# Patient Record
Sex: Male | Born: 1980 | Race: White | Hispanic: No | Marital: Married | Smoking: Never smoker
Health system: Southern US, Community
[De-identification: ages and names within clinical notes are randomized; demographics above are authoritative.]

## PROBLEM LIST (undated history)

## (undated) DIAGNOSIS — E119 Type 2 diabetes mellitus without complications: Secondary | ICD-10-CM

## (undated) DIAGNOSIS — M75122 Complete rotator cuff tear or rupture of left shoulder, not specified as traumatic: Secondary | ICD-10-CM

## (undated) HISTORY — PX: CORNEAL LACERATION REPAIR: SHX5331

## (undated) HISTORY — DX: Type 2 diabetes mellitus without complications: E11.9

---
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
## 2000-04-25 ENCOUNTER — Emergency Department (HOSPITAL_COMMUNITY): Admission: EM | Admit: 2000-04-25 | Discharge: 2000-04-25 | Payer: Self-pay | Admitting: Emergency Medicine

## 2005-05-25 ENCOUNTER — Emergency Department (HOSPITAL_COMMUNITY): Admission: EM | Admit: 2005-05-25 | Discharge: 2005-05-25 | Payer: Self-pay | Admitting: Family Medicine

## 2009-11-28 ENCOUNTER — Emergency Department (HOSPITAL_COMMUNITY): Admission: EM | Admit: 2009-11-28 | Discharge: 2009-11-28 | Payer: Self-pay | Admitting: Emergency Medicine

## 2019-03-07 ENCOUNTER — Other Ambulatory Visit: Payer: Self-pay | Admitting: Sports Medicine

## 2019-03-07 DIAGNOSIS — Z77018 Contact with and (suspected) exposure to other hazardous metals: Secondary | ICD-10-CM

## 2019-03-07 DIAGNOSIS — M25512 Pain in left shoulder: Secondary | ICD-10-CM

## 2019-03-16 ENCOUNTER — Other Ambulatory Visit: Payer: Self-pay | Admitting: Sports Medicine

## 2019-03-21 ENCOUNTER — Other Ambulatory Visit: Payer: Self-pay | Admitting: Sports Medicine

## 2019-03-21 ENCOUNTER — Ambulatory Visit
Admission: RE | Admit: 2019-03-21 | Discharge: 2019-03-21 | Disposition: A | Discharge status: Home / Self Care | Payer: BC Managed Care – PPO | Source: Ambulatory Visit | Attending: Sports Medicine | Admitting: Sports Medicine

## 2019-03-21 ENCOUNTER — Other Ambulatory Visit: Payer: Self-pay

## 2019-03-21 DIAGNOSIS — Z77018 Contact with and (suspected) exposure to other hazardous metals: Secondary | ICD-10-CM

## 2019-03-21 DIAGNOSIS — M25512 Pain in left shoulder: Secondary | ICD-10-CM

## 2019-03-28 ENCOUNTER — Other Ambulatory Visit: Payer: Self-pay

## 2019-03-28 ENCOUNTER — Encounter (HOSPITAL_BASED_OUTPATIENT_CLINIC_OR_DEPARTMENT_OTHER): Payer: Self-pay | Admitting: *Deleted

## 2019-04-01 ENCOUNTER — Other Ambulatory Visit (HOSPITAL_COMMUNITY)
Admission: RE | Admit: 2019-04-01 | Discharge: 2019-04-01 | Disposition: A | Discharge status: Home / Self Care | Payer: BC Managed Care – PPO | Source: Ambulatory Visit | Attending: Orthopedic Surgery | Admitting: Orthopedic Surgery

## 2019-04-01 DIAGNOSIS — Z01812 Encounter for preprocedural laboratory examination: Secondary | ICD-10-CM | POA: Diagnosis present

## 2019-04-01 DIAGNOSIS — Z1159 Encounter for screening for other viral diseases: Secondary | ICD-10-CM | POA: Diagnosis not present

## 2019-04-01 NOTE — Progress Notes (Signed)
Pt arrived to pick up Ensure Pre-Surgery drink. Written/verbal instructions given to finish drinking drink by 0815 on DOS. Pt verbalized understanding

## 2019-04-02 LAB — NOVEL CORONAVIRUS, NAA (HOSP ORDER, SEND-OUT TO REF LAB; TAT 18-24 HRS): SARS-CoV-2, NAA: NOT DETECTED

## 2019-04-05 ENCOUNTER — Ambulatory Visit (HOSPITAL_BASED_OUTPATIENT_CLINIC_OR_DEPARTMENT_OTHER): Payer: BC Managed Care – PPO | Admitting: Certified Registered Nurse Anesthetist

## 2019-04-05 ENCOUNTER — Encounter (HOSPITAL_BASED_OUTPATIENT_CLINIC_OR_DEPARTMENT_OTHER)
Admission: RE | Disposition: A | Discharge status: Home / Self Care | Payer: Self-pay | Source: Home / Self Care | Attending: Orthopedic Surgery

## 2019-04-05 ENCOUNTER — Ambulatory Visit (HOSPITAL_BASED_OUTPATIENT_CLINIC_OR_DEPARTMENT_OTHER)
Admission: RE | Admit: 2019-04-05 | Discharge: 2019-04-05 | Disposition: A | Discharge status: Home / Self Care | Payer: BC Managed Care – PPO | Attending: Orthopedic Surgery | Admitting: Orthopedic Surgery

## 2019-04-05 ENCOUNTER — Encounter (HOSPITAL_BASED_OUTPATIENT_CLINIC_OR_DEPARTMENT_OTHER): Payer: Self-pay | Admitting: *Deleted

## 2019-04-05 DIAGNOSIS — S43492A Other sprain of left shoulder joint, initial encounter: Secondary | ICD-10-CM | POA: Insufficient documentation

## 2019-04-05 DIAGNOSIS — S43005A Unspecified dislocation of left shoulder joint, initial encounter: Secondary | ICD-10-CM | POA: Insufficient documentation

## 2019-04-05 DIAGNOSIS — S46012A Strain of muscle(s) and tendon(s) of the rotator cuff of left shoulder, initial encounter: Secondary | ICD-10-CM | POA: Insufficient documentation

## 2019-04-05 DIAGNOSIS — W1789XA Other fall from one level to another, initial encounter: Secondary | ICD-10-CM | POA: Diagnosis not present

## 2019-04-05 DIAGNOSIS — Y9389 Activity, other specified: Secondary | ICD-10-CM | POA: Insufficient documentation

## 2019-04-05 DIAGNOSIS — M7542 Impingement syndrome of left shoulder: Secondary | ICD-10-CM | POA: Insufficient documentation

## 2019-04-05 DIAGNOSIS — M75122 Complete rotator cuff tear or rupture of left shoulder, not specified as traumatic: Secondary | ICD-10-CM

## 2019-04-05 DIAGNOSIS — M75102 Unspecified rotator cuff tear or rupture of left shoulder, not specified as traumatic: Secondary | ICD-10-CM | POA: Diagnosis present

## 2019-04-05 HISTORY — PX: SHOULDER ARTHROSCOPY WITH ROTATOR CUFF REPAIR AND SUBACROMIAL DECOMPRESSION: SHX5686

## 2019-04-05 HISTORY — DX: Complete rotator cuff tear or rupture of left shoulder, not specified as traumatic: M75.122

## 2019-04-05 HISTORY — PX: SHOULDER ARTHROSCOPY WITH BANKART REPAIR: SHX5673

## 2019-04-05 SURGERY — SHOULDER ARTHROSCOPY WITH ROTATOR CUFF REPAIR AND SUBACROMIAL DECOMPRESSION
Anesthesia: General | Site: Shoulder | Laterality: Left

## 2019-04-05 MED ORDER — MIDAZOLAM HCL 2 MG/2ML IJ SOLN
INTRAMUSCULAR | Status: AC
  Filled 2019-04-05: qty 2

## 2019-04-05 MED ORDER — BUPIVACAINE LIPOSOME 1.3 % IJ SUSP
INTRAMUSCULAR | Status: DC | PRN
  Administered 2019-04-05: 10 mL via PERINEURAL

## 2019-04-05 MED ORDER — PHENYLEPHRINE 40 MCG/ML (10ML) SYRINGE FOR IV PUSH (FOR BLOOD PRESSURE SUPPORT)
PREFILLED_SYRINGE | INTRAVENOUS | Status: AC
  Filled 2019-04-05: qty 10

## 2019-04-05 MED ORDER — LACTATED RINGERS IV SOLN: INTRAVENOUS | Status: DC

## 2019-04-05 MED ORDER — ACETAMINOPHEN 500 MG PO TABS
ORAL_TABLET | ORAL | Status: AC
  Filled 2019-04-05: qty 2

## 2019-04-05 MED ORDER — ACETAMINOPHEN 500 MG PO TABS
1000.0000 mg | ORAL_TABLET | Freq: Once | ORAL | Status: AC
  Administered 2019-04-05: 11:00:00 1000 mg via ORAL

## 2019-04-05 MED ORDER — SCOPOLAMINE 1 MG/3DAYS TD PT72: 1.0000 | MEDICATED_PATCH | Freq: Once | TRANSDERMAL | Status: DC | PRN

## 2019-04-05 MED ORDER — FENTANYL CITRATE (PF) 250 MCG/5ML IJ SOLN
INTRAMUSCULAR | Status: DC | PRN
  Administered 2019-04-05: 100 ug via INTRAVENOUS

## 2019-04-05 MED ORDER — METOCLOPRAMIDE HCL 5 MG/ML IJ SOLN: 10.0000 mg | Freq: Once | INTRAMUSCULAR | Status: DC | PRN

## 2019-04-05 MED ORDER — ONDANSETRON HCL 4 MG/2ML IJ SOLN
INTRAMUSCULAR | Status: AC
  Filled 2019-04-05: qty 2

## 2019-04-05 MED ORDER — SODIUM CHLORIDE 0.9 % IR SOLN
Status: DC | PRN
  Administered 2019-04-05: 35000 mL

## 2019-04-05 MED ORDER — OXYCODONE HCL 5 MG PO TABS: 5.0000 mg | ORAL_TABLET | ORAL | 0 refills | Status: DC | PRN

## 2019-04-05 MED ORDER — MEPERIDINE HCL 25 MG/ML IJ SOLN: 6.2500 mg | INTRAMUSCULAR | Status: DC | PRN

## 2019-04-05 MED ORDER — SODIUM CHLORIDE 0.9 % IV SOLN
INTRAVENOUS | Status: DC | PRN
  Administered 2019-04-05: 40 ug/min via INTRAVENOUS

## 2019-04-05 MED ORDER — SENNA-DOCUSATE SODIUM 8.6-50 MG PO TABS: 2.0000 | ORAL_TABLET | Freq: Every day | ORAL | 1 refills | Status: DC

## 2019-04-05 MED ORDER — CEFAZOLIN SODIUM-DEXTROSE 2-4 GM/100ML-% IV SOLN
INTRAVENOUS | Status: AC
  Filled 2019-04-05: qty 200

## 2019-04-05 MED ORDER — ROCURONIUM BROMIDE 10 MG/ML (PF) SYRINGE
PREFILLED_SYRINGE | INTRAVENOUS | Status: DC | PRN
  Administered 2019-04-05: 40 mg via INTRAVENOUS
  Administered 2019-04-05 (×2): 50 mg via INTRAVENOUS

## 2019-04-05 MED ORDER — DEXAMETHASONE SODIUM PHOSPHATE 10 MG/ML IJ SOLN
INTRAMUSCULAR | Status: DC | PRN
  Administered 2019-04-05: 5 mg via INTRAVENOUS

## 2019-04-05 MED ORDER — FENTANYL CITRATE (PF) 100 MCG/2ML IJ SOLN
50.0000 ug | INTRAMUSCULAR | Status: DC | PRN
  Administered 2019-04-05: 100 ug via INTRAVENOUS

## 2019-04-05 MED ORDER — GLYCOPYRROLATE 0.2 MG/ML IJ SOLN
INTRAMUSCULAR | Status: DC | PRN
  Administered 2019-04-05: 0.2 mg via INTRAVENOUS

## 2019-04-05 MED ORDER — ROCURONIUM BROMIDE 10 MG/ML (PF) SYRINGE
PREFILLED_SYRINGE | INTRAVENOUS | Status: AC
  Filled 2019-04-05: qty 10

## 2019-04-05 MED ORDER — SUGAMMADEX SODIUM 500 MG/5ML IV SOLN
INTRAVENOUS | Status: AC
  Filled 2019-04-05: qty 5

## 2019-04-05 MED ORDER — LIDOCAINE 2% (20 MG/ML) 5 ML SYRINGE
INTRAMUSCULAR | Status: DC | PRN
  Administered 2019-04-05: 100 mg via INTRAVENOUS

## 2019-04-05 MED ORDER — FENTANYL CITRATE (PF) 100 MCG/2ML IJ SOLN: 25.0000 ug | INTRAMUSCULAR | Status: DC | PRN

## 2019-04-05 MED ORDER — DEXAMETHASONE SODIUM PHOSPHATE 10 MG/ML IJ SOLN
INTRAMUSCULAR | Status: AC
  Filled 2019-04-05: qty 1

## 2019-04-05 MED ORDER — BUPIVACAINE HCL (PF) 0.5 % IJ SOLN
INTRAMUSCULAR | Status: DC | PRN
  Administered 2019-04-05: 15 mL

## 2019-04-05 MED ORDER — CEFAZOLIN SODIUM-DEXTROSE 2-4 GM/100ML-% IV SOLN
2.0000 g | INTRAVENOUS | Status: AC
  Administered 2019-04-05: 3 g via INTRAVENOUS

## 2019-04-05 MED ORDER — FENTANYL CITRATE (PF) 100 MCG/2ML IJ SOLN
INTRAMUSCULAR | Status: AC
  Filled 2019-04-05: qty 2

## 2019-04-05 MED ORDER — ONDANSETRON HCL 4 MG PO TABS: 4.0000 mg | ORAL_TABLET | Freq: Three times a day (TID) | ORAL | 0 refills | Status: DC | PRN

## 2019-04-05 MED ORDER — MIDAZOLAM HCL 2 MG/2ML IJ SOLN
1.0000 mg | INTRAMUSCULAR | Status: DC | PRN
  Administered 2019-04-05: 2 mg via INTRAVENOUS

## 2019-04-05 MED ORDER — PROPOFOL 10 MG/ML IV BOLUS
INTRAVENOUS | Status: DC | PRN
  Administered 2019-04-05: 200 mg via INTRAVENOUS

## 2019-04-05 MED ORDER — SUCCINYLCHOLINE CHLORIDE 200 MG/10ML IV SOSY
PREFILLED_SYRINGE | INTRAVENOUS | Status: AC
  Filled 2019-04-05: qty 10

## 2019-04-05 MED ORDER — LACTATED RINGERS IV SOLN
INTRAVENOUS | Status: DC
  Administered 2019-04-05 (×2): via INTRAVENOUS

## 2019-04-05 MED ORDER — BACLOFEN 10 MG PO TABS: 10.0000 mg | ORAL_TABLET | Freq: Three times a day (TID) | ORAL | 0 refills | Status: DC

## 2019-04-05 MED ORDER — SUCCINYLCHOLINE CHLORIDE 200 MG/10ML IV SOSY
PREFILLED_SYRINGE | INTRAVENOUS | Status: DC | PRN
  Administered 2019-04-05: 120 mg via INTRAVENOUS

## 2019-04-05 MED ORDER — PHENYLEPHRINE 40 MCG/ML (10ML) SYRINGE FOR IV PUSH (FOR BLOOD PRESSURE SUPPORT)
PREFILLED_SYRINGE | INTRAVENOUS | Status: DC | PRN
  Administered 2019-04-05 (×2): 80 ug via INTRAVENOUS
  Administered 2019-04-05: 120 ug via INTRAVENOUS
  Administered 2019-04-05: 80 ug via INTRAVENOUS

## 2019-04-05 MED ORDER — CHLORHEXIDINE GLUCONATE 4 % EX LIQD: 60.0000 mL | Freq: Once | CUTANEOUS | Status: DC

## 2019-04-05 MED ORDER — PROPOFOL 10 MG/ML IV BOLUS
INTRAVENOUS | Status: AC
  Filled 2019-04-05: qty 20

## 2019-04-05 MED ORDER — EPHEDRINE SULFATE-NACL 50-0.9 MG/10ML-% IV SOSY
PREFILLED_SYRINGE | INTRAVENOUS | Status: DC | PRN
  Administered 2019-04-05 (×2): 5 mg via INTRAVENOUS

## 2019-04-05 SURGICAL SUPPLY — 69 items
ANCH SUT SHRT 12.5 CANN EYLT (Anchor) ×1 IMPLANT
ANCHOR SUT BIOCOMP LK 2.9X12.5 (Anchor) ×2 IMPLANT
BLADE EXCALIBUR 4.0MM X 13CM (MISCELLANEOUS)
BLADE EXCALIBUR 4.0X13 (MISCELLANEOUS) IMPLANT
BLADE SURG 15 STRL LF DISP TIS (BLADE) IMPLANT
BLADE SURG 15 STRL SS (BLADE)
BURR OVAL 8 FLU 5.0MM X 13CM (MISCELLANEOUS)
BURR OVAL 8 FLU 5.0X13 (MISCELLANEOUS) IMPLANT
CANNULA 5.75X71 LONG (CANNULA) ×3 IMPLANT
CANNULA TWIST IN 8.25X7CM (CANNULA) ×2 IMPLANT
CLOSURE STERI-STRIP 1/2X4 (GAUZE/BANDAGES/DRESSINGS) ×1
CLSR STERI-STRIP ANTIMIC 1/2X4 (GAUZE/BANDAGES/DRESSINGS) ×2 IMPLANT
COVER WAND RF STERILE (DRAPES) IMPLANT
DECANTER SPIKE VIAL GLASS SM (MISCELLANEOUS) IMPLANT
DISSECTOR  3.8MM X 13CM (MISCELLANEOUS) ×2
DISSECTOR 3.8MM X 13CM (MISCELLANEOUS) ×1 IMPLANT
DRAPE IMP U-DRAPE 54X76 (DRAPES) ×3 IMPLANT
DRAPE INCISE IOBAN 66X45 STRL (DRAPES) ×3 IMPLANT
DRAPE SHOULDER BEACH CHAIR (DRAPES) ×3 IMPLANT
DRAPE U-SHAPE 47X51 STRL (DRAPES) ×3 IMPLANT
DRSG PAD ABDOMINAL 8X10 ST (GAUZE/BANDAGES/DRESSINGS) ×3 IMPLANT
DURAPREP 26ML APPLICATOR (WOUND CARE) ×3 IMPLANT
ELECT REM PT RETURN 9FT ADLT (ELECTROSURGICAL)
ELECTRODE REM PT RTRN 9FT ADLT (ELECTROSURGICAL) IMPLANT
FIBERSTICK 2 (SUTURE) IMPLANT
GAUZE SPONGE 4X4 12PLY STRL (GAUZE/BANDAGES/DRESSINGS) ×3 IMPLANT
GLOVE BIO SURGEON STRL SZ8 (GLOVE) ×3 IMPLANT
GLOVE BIOGEL PI IND STRL 8 (GLOVE) ×2 IMPLANT
GLOVE BIOGEL PI INDICATOR 8 (GLOVE) ×4
GLOVE ORTHO TXT STRL SZ7.5 (GLOVE) ×3 IMPLANT
GOWN STRL REUS W/ TWL LRG LVL3 (GOWN DISPOSABLE) ×1 IMPLANT
GOWN STRL REUS W/ TWL XL LVL3 (GOWN DISPOSABLE) ×2 IMPLANT
GOWN STRL REUS W/TWL LRG LVL3 (GOWN DISPOSABLE) ×3
GOWN STRL REUS W/TWL XL LVL3 (GOWN DISPOSABLE) ×6
IMMOBILIZER SHOULDER FOAM XLGE (SOFTGOODS) ×2 IMPLANT
IMPL SPEEDBRIDGE KIT (Orthopedic Implant) IMPLANT
IMPLANT SPEEDBRIDGE KIT (Orthopedic Implant) ×3 IMPLANT
KIT PUSHLOCK 2.9 HIP (KITS) ×2 IMPLANT
LASSO 90 CVE QUICKPAS (DISPOSABLE) ×2 IMPLANT
MANIFOLD NEPTUNE II (INSTRUMENTS) ×3 IMPLANT
NDL SCORPION MULTI FIRE (NEEDLE) IMPLANT
NEEDLE SCORPION MULTI FIRE (NEEDLE) ×3 IMPLANT
PACK ARTHROSCOPY DSU (CUSTOM PROCEDURE TRAY) ×3 IMPLANT
PACK BASIN DAY SURGERY FS (CUSTOM PROCEDURE TRAY) ×3 IMPLANT
PORT APPOLLO RF 90DEGREE MULTI (SURGICAL WAND) ×3 IMPLANT
SHEET MEDIUM DRAPE 40X70 STRL (DRAPES) ×3 IMPLANT
SLEEVE SCD COMPRESS KNEE MED (MISCELLANEOUS) ×3 IMPLANT
SLING ARM FOAM STRAP LRG (SOFTGOODS) IMPLANT
SLING ARM IMMOBILIZER LRG (SOFTGOODS) IMPLANT
SLING ARM IMMOBILIZER MED (SOFTGOODS) IMPLANT
SLING ARM IMMOBILIZER XL (CAST SUPPLIES) ×2 IMPLANT
SLING ARM MED ADULT FOAM STRAP (SOFTGOODS) IMPLANT
SLING ARM XL FOAM STRAP (SOFTGOODS) IMPLANT
SUPPORT WRAP ARM LG (MISCELLANEOUS) ×3 IMPLANT
SUT FIBERWIRE #2 38 T-5 BLUE (SUTURE) ×3
SUT MNCRL AB 4-0 PS2 18 (SUTURE) ×3 IMPLANT
SUT PDS AB 1 CT  36 (SUTURE)
SUT PDS AB 1 CT 36 (SUTURE) IMPLANT
SUT TIGER TAPE 7 IN WHITE (SUTURE) IMPLANT
SUT VIC AB 3-0 SH 27 (SUTURE)
SUT VIC AB 3-0 SH 27X BRD (SUTURE) IMPLANT
SUTURE FIBERWR #2 38 T-5 BLUE (SUTURE) IMPLANT
TAPE CLOTH SURG 6X10 WHT LF (GAUZE/BANDAGES/DRESSINGS) ×2 IMPLANT
TAPE FIBER 2MM 7IN #2 BLUE (SUTURE) IMPLANT
TOWEL GREEN STERILE FF (TOWEL DISPOSABLE) ×3 IMPLANT
TUBE CONNECTING 20'X1/4 (TUBING)
TUBE CONNECTING 20X1/4 (TUBING) IMPLANT
TUBING ARTHROSCOPY IRRIG 16FT (MISCELLANEOUS) ×3 IMPLANT
WATER STERILE IRR 1000ML POUR (IV SOLUTION) ×3 IMPLANT

## 2019-04-05 NOTE — Transfer of Care (Signed)
Immediate Anesthesia Transfer of Care Note  Patient: Henry Middleton  Procedure(s) Performed: LEFT SHOULDER ARTHROSCOPY WITH DEBRIDEMENT, ROTATOR CUFF REPAIR, SUBACROMIAL DECOMPRESSION, BANKHART REPAIR (Left Shoulder)  Patient Location: PACU  Anesthesia Type:General and Regional  Level of Consciousness: awake, alert  and oriented  Airway & Oxygen Therapy: Patient Spontanous Breathing and Patient connected to face mask oxygen  Post-op Assessment: Report given to RN and Post -op Vital signs reviewed and stable  Post vital signs: Reviewed and stable  Last Vitals:  Vitals Value Taken Time  BP 152/82 04/05/2019  4:15 PM  Temp    Pulse 99 04/05/2019  4:16 PM  Resp 11 04/05/2019  4:16 PM  SpO2 97 % 04/05/2019  4:16 PM  Vitals shown include unvalidated device data.  Last Pain:  Vitals:   04/05/19 1055  TempSrc: Oral  PainSc: 0-No pain         Complications: No apparent anesthesia complications

## 2019-04-05 NOTE — H&P (Signed)
PREOPERATIVE H&P  Chief Complaint: Left shoulder pain  HPI: Henry Middleton is a 38 y.o. male who presents for preoperative history and physical with a diagnosis of massive left shoulder rotator cuff tear after a dislocation. Symptoms are rated as moderate to severe, and have been worsening.  This is significantly impairing activities of daily living.  He has elected for surgical management.  This occurred after he fell off of the back of a truck.  Injury occurred approximately 1 month ago.  He has had severe pain located this diffusely over the left shoulder, difficulty lifting the arm.  Pain better with rest.  Past Medical History:  Diagnosis Date  . Medical history non-contributory    Past Surgical History:  Procedure Laterality Date  . CORNEAL LACERATION REPAIR     Social History   Socioeconomic History  . Marital status: Married    Spouse name: Not on file  . Number of children: Not on file  . Years of education: Not on file  . Highest education level: Not on file  Occupational History  . Not on file  Social Needs  . Financial resource strain: Not on file  . Food insecurity:    Worry: Not on file    Inability: Not on file  . Transportation needs:    Medical: Not on file    Non-medical: Not on file  Tobacco Use  . Smoking status: Never Smoker  . Smokeless tobacco: Never Used  Substance and Sexual Activity  . Alcohol use: Never    Frequency: Never  . Drug use: Never  . Sexual activity: Not on file  Lifestyle  . Physical activity:    Days per week: Not on file    Minutes per session: Not on file  . Stress: Not on file  Relationships  . Social connections:    Talks on phone: Not on file    Gets together: Not on file    Attends religious service: Not on file    Active member of club or organization: Not on file    Attends meetings of clubs or organizations: Not on file    Relationship status: Not on file  Other Topics Concern  . Not on file  Social History  Narrative  . Not on file   History reviewed. No pertinent family history. No Known Allergies Prior to Admission medications   Not on File     Positive ROS: All other systems have been reviewed and were otherwise negative with the exception of those mentioned in the HPI and as above.  Physical Exam: General: Alert, no acute distress Cardiovascular: No pedal edema Respiratory: No cyanosis, no use of accessory musculature GI: No organomegaly, abdomen is soft and non-tender Skin: No lesions in the area of chief complaint Neurologic: Sensation intact distally Psychiatric: Patient is competent for consent with normal mood and affect Lymphatic: No axillary or cervical lymphadenopathy  MUSCULOSKELETAL: Left shoulder active motion 0 to 40 degrees, profound weakness with cuff testing, no pain over the [facility] joint.  Infraspinatus is weak.  Biceps is nontender.  Assessment: Left shoulder massive rotator cuff tear after a dislocation with disruption of the supraspinatus and infraspinatus, probable labral tear   Plan: Plan for Procedure(s): LEFT SHOULDER ARTHROSCOPY WITH DEBRIDEMENT, ROTATOR CUFF REPAIR, SUBACROMIAL DECOMPRESSION, BANKART REPAIR  The risks benefits and alternatives were discussed with the patient including but not limited to the risks of nonoperative treatment, versus surgical intervention including infection, bleeding, nerve injury,  blood clots, cardiopulmonary complications, morbidity,  mortality, among others, and they were willing to proceed.  We also discussed the risks for recurrent tear, stiffness, recurrent instability, posttraumatic arthrosis, recurrent labral tear, among others.   Eulas PostJoshua P Shawnee Higham, MD Cell [phone]   04/05/2019 12:22 PM

## 2019-04-05 NOTE — Progress Notes (Signed)
Assisted Dr. Carignan with left, ultrasound guided, supraclavicular block. Side rails up, monitors on throughout procedure. See vital signs in flow sheet. Tolerated Procedure well. 

## 2019-04-05 NOTE — Anesthesia Preprocedure Evaluation (Signed)
Anesthesia Evaluation  Patient identified by MRN, date of birth, ID band Patient awake    Reviewed: Allergy & Precautions, NPO status , Patient's Chart, lab work & pertinent test results  Airway Mallampati: II  TM Distance: >3 FB Neck ROM: Full    Dental no notable dental hx.    Pulmonary neg pulmonary ROS,    Pulmonary exam normal breath sounds clear to auscultation       Cardiovascular negative cardio ROS Normal cardiovascular exam Rhythm:Regular Rate:Normal     Neuro/Psych negative neurological ROS  negative psych ROS   GI/Hepatic negative GI ROS, Neg liver ROS,   Endo/Other  negative endocrine ROS  Renal/GU negative Renal ROS  negative genitourinary   Musculoskeletal negative musculoskeletal ROS (+)   Abdominal   Peds negative pediatric ROS (+)  Hematology negative hematology ROS (+)   Anesthesia Other Findings   Reproductive/Obstetrics negative OB ROS                             Anesthesia Physical Anesthesia Plan  ASA: II  Anesthesia Plan: General   Post-op Pain Management:  Regional for Post-op pain   Induction: Intravenous  PONV Risk Score and Plan: 2  Airway Management Planned: Oral ETT  Additional Equipment:   Intra-op Plan:   Post-operative Plan: Extubation in OR  Informed Consent: I have reviewed the patients History and Physical, chart, labs and discussed the procedure including the risks, benefits and alternatives for the proposed anesthesia with the patient or authorized representative who has indicated his/her understanding and acceptance.     Dental advisory given  Plan Discussed with: CRNA  Anesthesia Plan Comments:         Anesthesia Quick Evaluation

## 2019-04-05 NOTE — Op Note (Signed)
04/05/2019  3:53 PM  PATIENT:  Henry Middleton    PRE-OPERATIVE DIAGNOSIS:    1. Left shoulder dislocation 2.  Left shoulder massive rotator cuff tear, infraspinatus and supraspinatus 3.  Left shoulder anterior labral tear bankart 4.  Left shoulder impingement syndrome   POST-OPERATIVE DIAGNOSIS:  Same  PROCEDURE:    1.  Left shoulder arthroscopy with extensive debridement 2.  Left shoulder arthroscopy with anterior labral repair, Bankart reconstruction 3.  Left shoulder arthroscopy with rotator cuff repair supraspinatus and infraspinatus 4.  Left shoulder arthroscopy with acromioplasty.  SURGEON:  Eulas Post, MD  PHYSICIAN ASSISTANT: Janace Litten, OPA-C, present and scrubbed throughout the case, critical for completion in a timely fashion, and for retraction, instrumentation, and closure.  ANESTHESIA:   General with regional block using Exparel  PREOPERATIVE INDICATIONS:  Henry Middleton is a  38 y.o. male   who fell off a truck and had a massive rotator cuff tear with instability after dislocation.  The risks benefits and alternatives were discussed with the patient preoperatively including but not limited to the risks of infection, bleeding, nerve injury, cardiopulmonary complications, the need for revision surgery, among others, stiffness, recurrent instability, inability to regain overhead activity, and the patient was willing to proceed.  ESTIMATED BLOOD LOSS: Minimal  OPERATIVE IMPLANTS: Arthrex Biocomposite SwiveLock 4.75 x 2 for the medial row preloaded with fibertape, and 2 lateral Biocomposite SwiveLock 4.75 mm anchors for the lateral row in a SpeedBridge configuration.  I did use the safety stitches, tied over the medial row to augment fixation.  I used a labral tape with a 2.9 mm bio composite push lock anchor for the anterior labrum.  OPERATIVE FINDINGS: The anterior labrum did have a tear.  There is no bone loss.  The rotator cuff had a massive tear that was  extremely poor quality tissue and very hard to mobilize.  There was a split between the infraspinatus and the supraspinatus.  I had just barely enough tendon to work with, was able to mobilize the anterior tissue reduced back to the posterior tuberosity, it was quite an unusual pattern.  There was some scuffing underneath the undersurface of the acromion with mild subacromial spurring.  The biceps was intact, subscapularis was intact, the glenohumeral articular cartilage was intact, the biceps did not appear unstable.  OPERATIVE PROCEDURE: The patient was brought to the operating room and placed in the supine position.  General anesthesia was administered and the patient positioned in a beach chair position.  IV antibiotics were given.  The upper extremity was prepped and draped in the usual sterile fashion.  Timeout performed.  Diagnostic arthroscopy was carried out with the above named findings.    The glenoid labrum was debrided anteriorly, and I placed a simple suture configuration through the labrum using a curved suture passer, and passed the labral tape into a push lock anchor secured at approximately the 3 o'clock position.  This had excellent stabilization of the anterior labrum.  I debrided the undersurface of the cuff as well as prepared the medial edge of the tuberosity for reimplantation using the shaver.  I used a traction suture in the anterolateral aspect of the tendon in order to achieve some level of reduction during passage of the sutures.  I went to the subacromial space, performed a complete bursectomy, subacromial CA ligament release, with a acromioplasty.  I evaluated the tear from viewing laterally, used the shaver from posterior laterally, debrided the tear as well as the  bony footprint, and prepared the tendon for reinsertion.  I did utilize a posterior lateral working portal as well as viewing portal.  I placed 2 anchors from above using an anterior and posterior portal with  percutaneous placement for the medial row preloaded with fiber tape in each anchor.  I passed the sutures using a scorpion suture passer from front to back.  I tied the medial row down, which provided initial reduction.  The posterior reduction was still somewhat challenged, but it was augmented once I brought the tapes into the posterior lateral anchor.  I was overall extremely satisfied with the repair although it was extremely difficult, with a high risk for failure.   Excellent fixation and reduction of the tendon was achieved.  I touched up the acromioplasty viewing from lateral portal.  The instruments were removed, the portals closed with Monocryl followed by Steri-Strips and sterile gauze.  The patient was awakened and returned to the PACU in stable and satisfactory condition.  There were no complications and He tolerated the procedure well.

## 2019-04-05 NOTE — Anesthesia Procedure Notes (Signed)
Anesthesia Regional Block: Supraclavicular block   Pre-Anesthetic Checklist: ,, timeout performed, Correct Patient, Correct Site, Correct Laterality, Correct Procedure, Correct Position, site marked, Risks and benefits discussed,  Surgical consent,  Pre-op evaluation,  At surgeon's request and post-op pain management  Laterality: Left and Upper  Prep: Maximum Sterile Barrier Precautions used, chloraprep       Needles:  Injection technique: Single-shot  Needle Type: Echogenic Stimulator Needle     Needle Length: 10cm      Additional Needles:   Procedures:,,,, ultrasound used (permanent image in chart),,,,  Narrative:  Start time: 04/05/2019 11:43 AM End time: 04/05/2019 11:53 AM Injection made incrementally with aspirations every 5 mL.  Performed by: Personally  Anesthesiologist: Phillips Grout, MD  Additional Notes: Risks, benefits and alternative to block explained extensively.  Patient tolerated procedure well, without complications.

## 2019-04-05 NOTE — Anesthesia Procedure Notes (Signed)
Procedure Name: Intubation Date/Time: 04/05/2019 12:54 PM Performed by: Myna Bright, CRNA Pre-anesthesia Checklist: Patient identified, Emergency Drugs available, Suction available and Patient being monitored Patient Re-evaluated:Patient Re-evaluated prior to induction Oxygen Delivery Method: Circle system utilized Preoxygenation: Pre-oxygenation with 100% oxygen Induction Type: IV induction Ventilation: Mask ventilation with difficulty Laryngoscope Size: Mac and 4 Grade View: Grade I Tube type: Oral Tube size: 8.0 mm Number of attempts: 1 Airway Equipment and Method: Stylet Placement Confirmation: ETT inserted through vocal cords under direct vision,  positive ETCO2 and breath sounds checked- equal and bilateral Secured at: 22 cm Tube secured with: Tape Dental Injury: Teeth and Oropharynx as per pre-operative assessment

## 2019-04-05 NOTE — Anesthesia Postprocedure Evaluation (Signed)
Anesthesia Post Note  Patient: Henry Middleton  Procedure(s) Performed: LEFT SHOULDER ARTHROSCOPY WITH DEBRIDEMENT, ROTATOR CUFF REPAIR, SUBACROMIAL DECOMPRESSION, BANKHART REPAIR (Left Shoulder)     Patient location during evaluation: PACU Anesthesia Type: General Level of consciousness: awake and alert Pain management: pain level controlled Vital Signs Assessment: post-procedure vital signs reviewed and stable Respiratory status: spontaneous breathing, nonlabored ventilation and respiratory function stable Cardiovascular status: blood pressure returned to baseline and stable Postop Assessment: no apparent nausea or vomiting Anesthetic complications: no    Last Vitals:  Vitals:   04/05/19 1616 04/05/19 1630  BP:    Pulse: 97 99  Resp: (!) 24 15  Temp:    SpO2: 97% 92%    Last Pain:  Vitals:   04/05/19 1630  TempSrc:   PainSc: 0-No pain                 Lowella Curb

## 2019-04-05 NOTE — Discharge Instructions (Signed)
Diet: As you were doing prior to hospitalization  ° °Shower:  Femia shower but keep the wounds dry, use an occlusive plastic wrap, NO SOAKING IN TUB.  If the bandage gets wet, change with a clean dry gauze.  If you have a splint on, leave the splint in place and keep the splint dry with a plastic bag. ° °Dressing:  You Wurtz change your dressing 3-5 days after surgery, unless you have a splint.  If you have a splint, then just leave the splint in place and we will change your bandages during your first follow-up appointment.   ° °If you had hand or foot surgery, we will plan to remove your stitches in about 2 weeks in the office.  For all other surgeries, there are sticky tapes (steri-strips) on your wounds and all the stitches are absorbable.  Leave the steri-strips in place when changing your dressings, they will peel off with time, usually 2-3 weeks. ° °Activity:  Increase activity slowly as tolerated, but follow the weight bearing instructions below.  The rules on driving is that you can not be taking narcotics while you drive, and you must feel in control of the vehicle.   ° °Weight Bearing:   Sling at all times except hygiene.   ° °To prevent constipation: you Giovanetti use a stool softener such as - ° °Colace (over the counter) 100 mg by mouth twice a day  °Drink plenty of fluids (prune juice Lapinsky be helpful) and high fiber foods °Miralax (over the counter) for constipation as needed.   ° °Itching:  If you experience itching with your medications, try taking only a single pain pill, or even half a pain pill at a time.  You Longnecker take up to 10 pain pills per day, and you can also use benadryl over the counter for itching or also to help with sleep.  ° °Precautions:  If you experience chest pain or shortness of breath - call 911 immediately for transfer to the hospital emergency department!! ° °If you develop a fever greater that 101 F, purulent drainage from wound, increased redness or drainage from wound, or calf pain --  Call the office at [phone]                                                °Follow- Up Appointment:  Please call for an appointment to be seen in 2 weeks [place] - [phone] ° ° ° °Post Anesthesia Home Care Instructions ° °Activity: °Get plenty of rest for the remainder of the day. A responsible individual must stay with you for 24 hours following the procedure.  °For the next 24 hours, DO NOT: °-Drive a car °-Operate machinery °-Drink alcoholic beverages °-Take any medication unless instructed by your physician °-Make any legal decisions or sign important papers. ° °Meals: °Start with liquid foods such as gelatin or soup. Progress to regular foods as tolerated. Avoid greasy, spicy, heavy foods. If nausea and/or vomiting occur, drink only clear liquids until the nausea and/or vomiting subsides. Call your physician if vomiting continues. ° °Special Instructions/Symptoms: °Your throat [place] feel dry or sore from the anesthesia or the breathing tube placed in your throat during surgery. If this causes discomfort, gargle with warm salt water. The discomfort should disappear within 24 hours. ° °If you had a scopolamine patch placed behind your ear for   the management of post- operative nausea and/or vomiting: ° °1. The medication in the patch is effective for 72 hours, after which it should be removed.  Wrap patch in a tissue and discard in the trash. Wash hands thoroughly with soap and water. °2. You Biermann remove the patch earlier than 72 hours if you experience unpleasant side effects which Mandelbaum include dry mouth, dizziness or visual disturbances. °3. Avoid touching the patch. Wash your hands with soap and water after contact with the patch. °  ° ° °Regional Anesthesia Blocks ° °1. Numbness or the inability to move the "blocked" extremity Axford last from 3-48 hours after placement. The length of time depends on the medication injected and your individual response to the medication. If the numbness is not going away  after 48 hours, call your surgeon. ° °2. The extremity that is blocked will need to be protected until the numbness is gone and the  Strength has returned. Because you cannot feel it, you will need to take extra care to avoid injury. Because it Rodier be weak, you Spillman have difficulty moving it or using it. You Medford not know what position it is in without looking at it while the block is in effect. ° °3. For blocks in the legs and feet, returning to weight bearing and walking needs to be done carefully. You will need to wait until the numbness is entirely gone and the strength has returned. You should be able to move your leg and foot normally before you try and bear weight or walk. You will need someone to be with you when you first try to ensure you do not fall and possibly risk injury. ° °4. Bruising and tenderness at the needle site are common side effects and will resolve in a few days. ° °5. Persistent numbness or new problems with movement should be communicated to the surgeon or the [facility] ([phone])/ [facility] ([phone]). ° ° °

## 2019-04-06 ENCOUNTER — Encounter (HOSPITAL_BASED_OUTPATIENT_CLINIC_OR_DEPARTMENT_OTHER): Payer: Self-pay | Admitting: Orthopedic Surgery

## 2019-04-08 ENCOUNTER — Encounter: Payer: Self-pay | Admitting: Family Medicine

## 2019-04-20 ENCOUNTER — Other Ambulatory Visit: Payer: Self-pay

## 2019-04-22 ENCOUNTER — Other Ambulatory Visit: Payer: Self-pay

## 2019-06-02 ENCOUNTER — Other Ambulatory Visit: Payer: Self-pay

## 2019-06-02 ENCOUNTER — Encounter (HOSPITAL_COMMUNITY): Payer: Self-pay | Admitting: Occupational Therapy

## 2019-06-02 ENCOUNTER — Ambulatory Visit (HOSPITAL_COMMUNITY): Payer: BC Managed Care – PPO | Attending: Orthopedic Surgery | Admitting: Occupational Therapy

## 2019-06-02 DIAGNOSIS — R29898 Other symptoms and signs involving the musculoskeletal system: Secondary | ICD-10-CM | POA: Diagnosis present

## 2019-06-02 DIAGNOSIS — M25512 Pain in left shoulder: Secondary | ICD-10-CM | POA: Insufficient documentation

## 2019-06-02 DIAGNOSIS — M25612 Stiffness of left shoulder, not elsewhere classified: Secondary | ICD-10-CM | POA: Insufficient documentation

## 2019-06-02 NOTE — Therapy (Signed)
[facility] [address] [place], [place], [address] Phone: [phone]   Fax:  [phone]  Occupational Therapy Evaluation  Patient Details  Name: Henry Middleton MRN: [id number] Date of Birth: 10/20/1981 Referring Provider (OT): Dr. Teryl LucyJoshua Landau   Encounter Date: 06/02/2019  OT End of Session - 06/02/19 1657    Visit Number  1    Number of Visits  16    Date for OT Re-Evaluation  08/01/19   mini-reassessment 06/30/2019   Authorization Type  [employer] P[employer]    Authorization Time Period  No visit limit    OT Start Time  1617    OT Stop Time  1652    OT Time Calculation (min)  35 min       Past Medical History:  Diagnosis Date  . Complete rotator cuff rupture of left shoulder 04/05/2019    Past Surgical History:  Procedure Laterality Date  . CORNEAL LACERATION REPAIR    . SHOULDER ARTHROSCOPY WITH BANKART REPAIR Left 04/05/2019   Procedure: SHOULDER ARTHROSCOPY WITH BANKART REPAIR;  Surgeon: Teryl LucyLandau, Joshua, MD;  Location: [facility];  Service: Orthopedics;  Laterality: Left;  . SHOULDER ARTHROSCOPY WITH ROTATOR CUFF REPAIR AND SUBACROMIAL DECOMPRESSION Left 04/05/2019   Procedure: LEFT SHOULDER ARTHROSCOPY WITH DEBRIDEMENT, ROTATOR CUFF REPAIR, SUBACROMIAL DECOMPRESSION;  Surgeon: Teryl LucyLandau, Joshua, MD;  Location: [facility];  Service: Orthopedics;  Laterality: Left;    There were no vitals filed for this visit.  Subjective Assessment - 06/02/19 1625    Subjective   S: I've been trying to work on this arm in the pool.    Pertinent History  Pt is a 38 y/o male s/p left RTC repair with bankart (arthroscopic) on 04/05/19. Pt sustained a massive rotator cuff tear and dislocated shoulder after falling off a truck and hitting the side of the attached trailer. Pt was referred to occupational therapy for evaluation and treatment by Dr. Teryl LucyJoshua Landau.    Special Tests  FOTO: 61/100    Patient Stated Goals  To  be able to use my arm and return to work.    Currently in Pain?  No/denies        [facility] Assessment - 06/02/19 1607      Assessment   Medical Diagnosis  s/p left RTC repair with bankart     Referring Provider (OT)  Dr. Teryl LucyJoshua Landau    Onset Date/Surgical Date  04/05/19    Hand Dominance  Right    Next MD Visit  06/13/2019    Prior Therapy  None      Precautions   Precautions  Shoulder    Type of Shoulder Precautions  Pt is 8 weeks out from sx. Per MDs standard protocol pt can progress as tolerated at this time.     Shoulder Interventions  Shoulder sling/immobilizer   out in community     Balance Screen   Has the patient fallen in the past 6 months  No    Has the patient had a decrease in activity level because of a fear of falling?   No    Is the patient reluctant to leave their home because of a fear of falling?   No      Prior Function   Level of Independence  Independent    Vocation  Full time employment    Vocation Requirements  Truck maintenance for schools-lifting, pulling, reaching, etc.     Leisure  swimming,  home shop-car maintenance       ADL   ADL comments  Pt is having difficulty with dressing, reaching overhead and behind back, lifting objects      Written Expression   Dominant Hand  Right      Cognition   Overall Cognitive Status  Within Functional Limits for tasks assessed      Observation/Other Assessments   Focus on Therapeutic Outcomes (FOTO)   61/100      ROM / Strength   AROM / PROM / Strength  AROM;PROM;Strength      Palpation   Palpation comment  moderate fascial restrictions in left upper arm and scapular regions      AROM   Overall AROM Comments  Assessed seated, er/IR adducted    AROM Assessment Site  Shoulder    Right/Left Shoulder  Left    Left Shoulder Flexion  95 Degrees    Left Shoulder ABduction  81 Degrees    Left Shoulder Internal Rotation  90 Degrees    Left Shoulder External Rotation  0 Degrees      PROM   Overall PROM  Comments  Assessed supine, er/IR adducted    PROM Assessment Site  Shoulder    Right/Left Shoulder  Left    Left Shoulder Flexion  129 Degrees    Left Shoulder ABduction  122 Degrees    Left Shoulder Internal Rotation  90 Degrees    Left Shoulder External Rotation  20 Degrees      Strength   Overall Strength Comments  Assessed seated, er/IR adducted    Strength Assessment Site  Shoulder    Right/Left Shoulder  Left    Left Shoulder Flexion  3/5    Left Shoulder ABduction  3/5    Left Shoulder Internal Rotation  3+/5    Left Shoulder External Rotation  3-/5                      OT Education - 06/02/19 1639    Education Details  Shoulder AA/ROM    Person(s) Educated  Patient    Methods  Explanation;Demonstration;Handout    Comprehension  Verbalized understanding;Returned demonstration       OT Short Term Goals - 06/02/19 1703      OT SHORT TERM GOAL #1   Title  Pt will be provided with and educated on HEP to improve functional use of LUE during ADL completion.    Time  4    Period  Weeks    Status  New    Target Date  07/02/19      OT SHORT TERM GOAL #2   Title  Pt will improve LUE P/ROM to WNL to improve ability to donn shirts with minimal compensatory strategies.    Time  4    Period  Weeks    Status  New      OT SHORT TERM GOAL #3   Title  Pt will increase LUE strength to 4-/5 to improve ability to reach for items in low overhead cabinets.        OT Long Term Goals - 06/02/19 1706      OT LONG TERM GOAL #1   Title  Pt will achieve highest level of functioning using LUE as non-dominant during ADLs and work tasks.    Time  8    Period  Weeks    Status  New    Target Date  08/01/19      OT LONG  TERM GOAL #2   Title  Pt will decrease pain in LUE to 2/10 or less to improve ability to sleep comfortably at night.    Time  8    Period  Weeks    Status  New      OT LONG TERM GOAL #3   Title  Pt will decrease fascial restriction in LUE to minimal  amounts or less to improve mobility required for functional reaching tasks.    Time  8    Period  Weeks    Status  New      OT LONG TERM GOAL #4   Title  Pt will increase LUE A/ROM to [facility] to improve ability to perform overhead reaching tasks at home and work.    Time  8    Period  Weeks    Status  New      OT LONG TERM GOAL #5   Title  Pt will improve LUE strength to 5/5 to increase ability to perform work tasks.    Time  8    Period  Weeks    Status  New            Plan - 06/02/19 1700    Clinical Impression Statement  A: Pt is a 38 y/o male s/p left rotator cuff repair with bankart on 04/05/19. Pt presents with ROM and strength deficits limiting use during functional tasks. Pt reports he has been working on his mobility in the pool and with simple exercises at home.    OT Occupational Profile and History  Problem Focused Assessment - Including review of records relating to presenting problem    Occupational performance deficits (Please refer to evaluation for details):  ADL's;IADL's;Rest and Sleep;Work;Leisure    Body Structure / Function / Physical Skills  ADL;Endurance;UE functional use;Fascial restriction;Pain;Flexibility;ROM;IADL;Strength    Rehab Potential  Good    Clinical Decision Making  Limited treatment options, no task modification necessary    Comorbidities Affecting Occupational Performance:  None    Modification or Assistance to Complete Evaluation   No modification of tasks or assist necessary to complete eval    OT Frequency  2x / week    OT Duration  8 weeks    OT Treatment/Interventions  Self-care/ADL training;Ultrasound;Patient/family education;Passive range of motion;Cryotherapy;Electrical Stimulation;Moist Heat;Therapeutic exercise;Manual Therapy;Therapeutic activities    Plan  P: Pt will benefit from skilled OT services to decrease pain and fascial restrictions, increase joint ROM, strength, and functional use of LUE as dominant. Treatment plan: Myofascial  release, manual therapy, P/ROM, AA/ROM, A/ROM, scapular mobility and strengthening, general LUE strengthening, modalities prn    Consulted and Agree with Plan of Care  Patient       Patient will benefit from skilled therapeutic intervention in order to improve the following deficits and impairments:   Body Structure / Function / Physical Skills: ADL, Endurance, UE functional use, Fascial restriction, Pain, Flexibility, ROM, IADL, Strength       Visit Diagnosis: 1. Acute pain of left shoulder   2. Other symptoms and signs involving the musculoskeletal system   3. Stiffness of left shoulder, not elsewhere classified       Problem List Patient Active Problem List   Diagnosis Date Noted  . Complete rotator cuff rupture of left shoulder 04/05/2019   Ezra SitesLeslie Greg Eckrich, OTR/Middleton  [phone] 06/02/2019, 5:09 PM  [facility] [address] [place], [place], [address] Phone: [phone]   Fax:  [phone]  Name: Henry Middleton  Middleton MRN: [id number] Date of Birth: 05/17/1981

## 2019-06-02 NOTE — Patient Instructions (Signed)

## 2019-06-03 ENCOUNTER — Encounter (HOSPITAL_COMMUNITY): Payer: Self-pay | Admitting: Occupational Therapy

## 2019-06-03 ENCOUNTER — Ambulatory Visit (HOSPITAL_COMMUNITY): Payer: BC Managed Care – PPO | Admitting: Occupational Therapy

## 2019-06-03 DIAGNOSIS — M25612 Stiffness of left shoulder, not elsewhere classified: Secondary | ICD-10-CM

## 2019-06-03 DIAGNOSIS — M25512 Pain in left shoulder: Secondary | ICD-10-CM

## 2019-06-03 DIAGNOSIS — R29898 Other symptoms and signs involving the musculoskeletal system: Secondary | ICD-10-CM

## 2019-06-03 NOTE — Therapy (Signed)
[place], [place], [address] Phone: [phone]   Fax:  [phone]  Occupational Therapy Treatment  Patient Details  Name: Henry Middleton MRN: [id number] Date of Birth: Nov 17, 1980 Referring Provider (OT): Dr. Marchia Bond   Encounter Date: 06/03/2019  OT End of Session - 06/03/19 1001    Visit Number  2    Number of Visits  16    Date for OT Re-Evaluation  08/01/19   mini-reassessment 06/30/2019   Authorization Type  [place] PPO    Authorization Time Period  No visit limit    OT Start Time  0917    OT Stop Time  0958    OT Time Calculation (min)  41 min       Past Medical History:  Diagnosis Date  . Complete rotator cuff rupture of left shoulder 04/05/2019    Past Surgical History:  Procedure Laterality Date  . CORNEAL LACERATION REPAIR    . SHOULDER ARTHROSCOPY WITH BANKART REPAIR Left 04/05/2019   Procedure: SHOULDER ARTHROSCOPY WITH BANKART REPAIR;  Surgeon: Marchia Bond, MD;  Location: [facility];  Service: Orthopedics;  Laterality: Left;  . SHOULDER ARTHROSCOPY WITH ROTATOR CUFF REPAIR AND SUBACROMIAL DECOMPRESSION Left 04/05/2019   Procedure: LEFT SHOULDER ARTHROSCOPY WITH DEBRIDEMENT, ROTATOR CUFF REPAIR, SUBACROMIAL DECOMPRESSION;  Surgeon: Marchia Bond, MD;  Location: [facility];  Service: Orthopedics;  Laterality: Left;    There were no vitals filed for this visit.  Subjective Assessment - 06/03/19 0919    Subjective   S: The stretches went good last night.    Currently in Pain?  No/denies         [facility] OT Assessment - 06/03/19 0918      Assessment   Medical Diagnosis  s/p left RTC repair with bankart       Precautions   Precautions  Shoulder    Type of Shoulder Precautions  Pt is 8 weeks out from sx. Per MDs standard protocol pt can progress as tolerated at this time.     Shoulder Interventions  Shoulder sling/immobilizer   when out in  community               OT Treatments/Exercises (OP) - 06/03/19 0919      Exercises   Exercises  Shoulder      Shoulder Exercises: Supine   Protraction  PROM;10 reps;AAROM;12 reps    Horizontal ABduction  PROM;5 reps;AAROM;12 reps    External Rotation  PROM;10 reps;AAROM;12 reps    Internal Rotation  PROM;10 reps;AAROM;12 reps    Flexion  PROM;10 reps;AAROM;12 reps    ABduction  PROM;10 reps;AAROM;12 reps      Shoulder Exercises: Standing   Protraction  AAROM;10 reps    Horizontal ABduction  AAROM;10 reps    External Rotation  AAROM;10 reps    Internal Rotation  AAROM;10 reps    Flexion  AAROM;10 reps    ABduction  AAROM;10 reps    Extension  AROM;10 reps      Shoulder Exercises: Pulleys   Flexion  1 minute    ABduction  1 minute      Manual Therapy   Manual Therapy  Myofascial release    Manual therapy comments  completed separately from therapeutic exercises    Myofascial Release  myofascial release and manual therapy to left upper arm, deltoid, and scapularis regions to decrease pain and fascial restrictions and increase joint ROM  OT Short Term Goals - 06/03/19 1003      OT SHORT TERM GOAL #1   Title  Pt will be provided with and educated on HEP to improve functional use of LUE during ADL completion.    Time  4    Period  Weeks    Status  On-going    Target Date  07/02/19      OT SHORT TERM GOAL #2   Title  Pt will improve LUE P/ROM to WNL to improve ability to donn shirts with minimal compensatory strategies.    Time  4    Period  Weeks    Status  On-going      OT SHORT TERM GOAL #3   Title  Pt will increase LUE strength to 4-/5 to improve ability to reach for items in low overhead cabinets.    Status  On-going        OT Long Term Goals - 06/03/19 1003      OT LONG TERM GOAL #1   Title  Pt will achieve highest level of functioning using LUE as non-dominant during ADLs and work tasks.    Time  8    Period  Weeks     Status  On-going      OT LONG TERM GOAL #2   Title  Pt will decrease pain in LUE to 2/10 or less to improve ability to sleep comfortably at night.    Time  8    Period  Weeks    Status  On-going      OT LONG TERM GOAL #3   Title  Pt will decrease fascial restriction in LUE to minimal amounts or less to improve mobility required for functional reaching tasks.    Time  8    Period  Weeks    Status  On-going      OT LONG TERM GOAL #4   Title  Pt will increase LUE A/ROM to [facility] to improve ability to perform overhead reaching tasks at home and work.    Time  8    Period  Weeks    Status  On-going      OT LONG TERM GOAL #5   Title  Pt will improve LUE strength to 5/5 to increase ability to perform work tasks.    Time  8    Period  Weeks    Status  On-going            Plan - 06/03/19 1001    Clinical Impression Statement  A: Initiated myofascial release, passive stretching, AA/ROM supine and standing, and pulley exercises. Pt able to tolerate ROM to 75% both passive and active assisted. Reports mild pain at end stretch with passive ROM. Verbal cuing for form and technique.    Body Structure / Function / Physical Skills  ADL;Endurance;UE functional use;Fascial restriction;Pain;Flexibility;ROM;IADL;Strength    Plan  P: Continue with passive stretching and AA/ROM, add prot/ret/elev/dep and proximal shoulder strengthening tasks       Patient will benefit from skilled therapeutic intervention in order to improve the following deficits and impairments:   Body Structure / Function / Physical Skills: ADL, Endurance, UE functional use, Fascial restriction, Pain, Flexibility, ROM, IADL, Strength       Visit Diagnosis: 1. Acute pain of left shoulder   2. Other symptoms and signs involving the musculoskeletal system   3. Stiffness of left shoulder, not elsewhere classified       Problem List Patient Active Problem List  Diagnosis Date Noted  . Complete rotator cuff rupture of  left shoulder 04/05/2019   Guadelupe Sabin, OTR/L  [phone] 06/03/2019, 10:03 AM  [place], [place], [address] Phone: [phone]   Fax:  [phone]  Name: Henry Middleton MRN: [id number] Date of Birth: March 03, 1981

## 2019-06-07 ENCOUNTER — Ambulatory Visit (HOSPITAL_COMMUNITY): Payer: BC Managed Care – PPO | Admitting: Occupational Therapy

## 2019-06-07 ENCOUNTER — Encounter (HOSPITAL_COMMUNITY): Payer: Self-pay | Admitting: Occupational Therapy

## 2019-06-07 ENCOUNTER — Other Ambulatory Visit: Payer: Self-pay

## 2019-06-07 DIAGNOSIS — M25612 Stiffness of left shoulder, not elsewhere classified: Secondary | ICD-10-CM

## 2019-06-07 DIAGNOSIS — M25512 Pain in left shoulder: Secondary | ICD-10-CM | POA: Diagnosis not present

## 2019-06-07 DIAGNOSIS — R29898 Other symptoms and signs involving the musculoskeletal system: Secondary | ICD-10-CM

## 2019-06-07 NOTE — Therapy (Signed)
[place], [place], [address] Phone: [phone]   Fax:  [phone]  Occupational Therapy Treatment  Patient Details  Name: Henry Middleton MRN: [id number] Date of Birth: 09/09/1981 Referring Provider (OT): Dr. Marchia Bond   Encounter Date: 06/07/2019  OT End of Session - 06/07/19 1657    Visit Number  3    Number of Visits  16    Date for OT Re-Evaluation  08/01/19   mini-reassessment 06/30/2019   Authorization Type  [place] PPO    Authorization Time Period  No visit limit    OT Start Time  1613    OT Stop Time  1652    OT Time Calculation (min)  39 min    Activity Tolerance  Patient tolerated treatment well    Behavior During Therapy  [facility] for tasks assessed/performed       Past Medical History:  Diagnosis Date  . Complete rotator cuff rupture of left shoulder 04/05/2019    Past Surgical History:  Procedure Laterality Date  . CORNEAL LACERATION REPAIR    . SHOULDER ARTHROSCOPY WITH BANKART REPAIR Left 04/05/2019   Procedure: SHOULDER ARTHROSCOPY WITH BANKART REPAIR;  Surgeon: Marchia Bond, MD;  Location: [facility];  Service: Orthopedics;  Laterality: Left;  . SHOULDER ARTHROSCOPY WITH ROTATOR CUFF REPAIR AND SUBACROMIAL DECOMPRESSION Left 04/05/2019   Procedure: LEFT SHOULDER ARTHROSCOPY WITH DEBRIDEMENT, ROTATOR CUFF REPAIR, SUBACROMIAL DECOMPRESSION;  Surgeon: Marchia Bond, MD;  Location: [facility];  Service: Orthopedics;  Laterality: Left;    There were no vitals filed for this visit.  Subjective Assessment - 06/07/19 1614    Subjective   S: I'm trying to stretch my arm as far as I can without damaging it.    Currently in Pain?  No/denies         [facility] OT Assessment - 06/07/19 1613      Assessment   Medical Diagnosis  s/p left RTC repair with bankart       Precautions   Precautions  Shoulder    Type of Shoulder Precautions  Pt is 8 weeks out from  sx. Per MDs standard protocol pt can progress as tolerated at this time.     Shoulder Interventions  Shoulder sling/immobilizer   when out in the community              OT Treatments/Exercises (OP) - 06/07/19 1614      Exercises   Exercises  Shoulder      Shoulder Exercises: Supine   Protraction  PROM;5 reps;AROM;10 reps    Horizontal ABduction  PROM;5 reps;AROM;10 reps    External Rotation  PROM;5 reps;AAROM;12 reps    Internal Rotation  PROM;5 reps;AAROM;12 reps    Flexion  PROM;5 reps;AAROM;12 reps    ABduction  PROM;5 reps;AAROM;12 reps      Shoulder Exercises: Standing   Protraction  AROM;10 reps    Horizontal ABduction  AROM;10 reps    External Rotation  AAROM;10 reps    Internal Rotation  AAROM;10 reps    Flexion  AAROM;10 reps    ABduction  AAROM;10 reps    Extension  Theraband;10 reps    Theraband Level (Shoulder Extension)  Level 2 (Red)    Row  Theraband;10 reps    Theraband Level (Shoulder Row)  Level 2 (Red)      Shoulder Exercises: Pulleys   Flexion  1 minute    ABduction  1 minute  Shoulder Exercises: ROM/Strengthening   Proximal Shoulder Strengthening, Supine  10x each, no rest breaks    Proximal Shoulder Strengthening, Seated  10X each, no rest breaks    Prot/Ret//Elev/Dep  1'    Rhythmic Stabilization, Supine  20 seconds at 45 degrees, 90 degrees, and 120 degrees, min difficulty    Other ROM/Strengthening Exercises  proximal shoulder strengthening on doorway, 1' flexion      Manual Therapy   Manual Therapy  Myofascial release    Manual therapy comments  completed separately from therapeutic exercises    Myofascial Release  myofascial release and manual therapy to left upper arm, deltoid, and scapularis regions to decrease pain and fascial restrictions and increase joint ROM                OT Short Term Goals - 06/03/19 1003      OT SHORT TERM GOAL #1   Title  Pt will be provided with and educated on HEP to improve functional  use of LUE during ADL completion.    Time  4    Period  Weeks    Status  On-going    Target Date  07/02/19      OT SHORT TERM GOAL #2   Title  Pt will improve LUE P/ROM to WNL to improve ability to donn shirts with minimal compensatory strategies.    Time  4    Period  Weeks    Status  On-going      OT SHORT TERM GOAL #3   Title  Pt will increase LUE strength to 4-/5 to improve ability to reach for items in low overhead cabinets.    Status  On-going        OT Long Term Goals - 06/03/19 1003      OT LONG TERM GOAL #1   Title  Pt will achieve highest level of functioning using LUE as non-dominant during ADLs and work tasks.    Time  8    Period  Weeks    Status  On-going      OT LONG TERM GOAL #2   Title  Pt will decrease pain in LUE to 2/10 or less to improve ability to sleep comfortably at night.    Time  8    Period  Weeks    Status  On-going      OT LONG TERM GOAL #3   Title  Pt will decrease fascial restriction in LUE to minimal amounts or less to improve mobility required for functional reaching tasks.    Time  8    Period  Weeks    Status  On-going      OT LONG TERM GOAL #4   Title  Pt will increase LUE A/ROM to [facility] to improve ability to perform overhead reaching tasks at home and work.    Time  8    Period  Weeks    Status  On-going      OT LONG TERM GOAL #5   Title  Pt will improve LUE strength to 5/5 to increase ability to perform work tasks.    Time  8    Period  Weeks    Status  On-going            Plan - 06/07/19 1657    Clinical Impression Statement  A: Continued with manual therapy today working on anterior shoulder to decrease tightness and improve ROM. Continued with AA/ROM for flexion/er/IR/abduction, progressed to A/ROM protraction and horizontal abduction.  Added prot/ret/elev/dep, proximal shoulder strengthening, and scapular theraband for shoulder stability. Verbal cuing for form and technique.    Body Structure / Function / Physical  Skills  ADL;Endurance;UE functional use;Fascial restriction;Pain;Flexibility;ROM;IADL;Strength    Plan  P: Continue working to improve ROM, continue with proximal shoulder strengthening to improve stability required for functional tasks       Patient will benefit from skilled therapeutic intervention in order to improve the following deficits and impairments:   Body Structure / Function / Physical Skills: ADL, Endurance, UE functional use, Fascial restriction, Pain, Flexibility, ROM, IADL, Strength       Visit Diagnosis: 1. Acute pain of left shoulder   2. Other symptoms and signs involving the musculoskeletal system   3. Stiffness of left shoulder, not elsewhere classified       Problem List Patient Active Problem List   Diagnosis Date Noted  . Complete rotator cuff rupture of left shoulder 04/05/2019   Guadelupe Sabin, OTR/L  [phone] 06/07/2019, 5:01 PM  [place] [address] [place], [place], [address] Phone: [phone]   Fax:  [phone]  Name: Henry Middleton MRN: [id number] Date of Birth: [birth year]

## 2019-06-09 ENCOUNTER — Encounter (HOSPITAL_COMMUNITY): Payer: Self-pay | Admitting: Occupational Therapy

## 2019-06-09 ENCOUNTER — Ambulatory Visit (HOSPITAL_COMMUNITY): Payer: BC Managed Care – PPO | Admitting: Occupational Therapy

## 2019-06-09 ENCOUNTER — Other Ambulatory Visit: Payer: Self-pay

## 2019-06-09 DIAGNOSIS — R29898 Other symptoms and signs involving the musculoskeletal system: Secondary | ICD-10-CM

## 2019-06-09 DIAGNOSIS — M25612 Stiffness of left shoulder, not elsewhere classified: Secondary | ICD-10-CM

## 2019-06-09 DIAGNOSIS — M25512 Pain in left shoulder: Secondary | ICD-10-CM | POA: Diagnosis not present

## 2019-06-09 NOTE — Therapy (Signed)
[place], [place], [address] Phone: [phone]   Fax:  [phone]  Occupational Therapy Treatment  Patient Details  Name: Henry Middleton MRN: [id number] Date of Birth: [birth year] Referring Provider (OT): Dr. Marchia Bond   Encounter Date: 06/09/2019  OT End of Session - 06/09/19 1653    Visit Number  4    Number of Visits  16    Date for OT Re-Evaluation  08/01/19   mini-reassessment 06/30/2019   Authorization Type  [place] PPO    Authorization Time Period  No visit limit    OT Start Time  1613    OT Stop Time  1653    OT Time Calculation (min)  40 min    Activity Tolerance  Patient tolerated treatment well    Behavior During Therapy  [facility] for tasks assessed/performed       Past Medical History:  Diagnosis Date  . Complete rotator cuff rupture of left shoulder 04/05/2019    Past Surgical History:  Procedure Laterality Date  . CORNEAL LACERATION REPAIR    . SHOULDER ARTHROSCOPY WITH BANKART REPAIR Left 04/05/2019   Procedure: SHOULDER ARTHROSCOPY WITH BANKART REPAIR;  Surgeon: Marchia Bond, MD;  Location: [place];  Service: Orthopedics;  Laterality: Left;  . SHOULDER ARTHROSCOPY WITH ROTATOR CUFF REPAIR AND SUBACROMIAL DECOMPRESSION Left 04/05/2019   Procedure: LEFT SHOULDER ARTHROSCOPY WITH DEBRIDEMENT, ROTATOR CUFF REPAIR, SUBACROMIAL DECOMPRESSION;  Surgeon: Marchia Bond, MD;  Location: [place];  Service: Orthopedics;  Laterality: Left;    There were no vitals filed for this visit.  Subjective Assessment - 06/09/19 1613    Subjective   S: It's still tight in that one particular spot. (anterior shoulder)    Currently in Pain?  No/denies         [facility] OT Assessment - 06/09/19 1612      Assessment   Medical Diagnosis  s/p left RTC repair with bankart       Precautions   Precautions  Shoulder    Type of Shoulder Precautions  Pt is 8 weeks out from  sx. Per MDs standard protocol pt can progress as tolerated at this time.     Shoulder Interventions  Shoulder sling/immobilizer   in community              OT Treatments/Exercises (OP) - 06/09/19 1614      Exercises   Exercises  Shoulder      Shoulder Exercises: Supine   Protraction  PROM;5 reps;AROM;10 reps    Horizontal ABduction  PROM;5 reps;AROM;10 reps    External Rotation  PROM;5 reps;AROM;10 reps    Internal Rotation  PROM;5 reps;AROM;10 reps    Flexion  PROM;5 reps;AROM;10 reps    ABduction  PROM;5 reps;AROM;10 reps      Shoulder Exercises: Standing   Protraction  AROM;12 reps    Horizontal ABduction  AROM;10 reps    External Rotation  AROM;10 reps    Internal Rotation  AROM;10 reps    Flexion  AAROM;12 reps    ABduction  AAROM;12 reps    Extension  Theraband;10 reps    Theraband Level (Shoulder Extension)  Level 2 (Red)    Row  Theraband;10 reps    Theraband Level (Shoulder Row)  Level 2 (Red)    Retraction  Theraband;10 reps    Theraband Level (Shoulder Retraction)  Level 2 (Red)      Shoulder Exercises: Pulleys   Flexion  1 minute    ABduction  1 minute      Shoulder Exercises: ROM/Strengthening   Thumb Tacks  1'    Proximal Shoulder Strengthening, Supine  10x each, no rest breaks    Proximal Shoulder Strengthening, Seated  10X each, no rest breaks    Ball on Wall  1' flexion 1' abduction    Prot/Ret//Elev/Dep  1'    Other ROM/Strengthening Exercises  ball pass behind back for IR, behind head for er, 10X each, using tennis ball      Manual Therapy   Manual Therapy  Myofascial release    Manual therapy comments  completed separately from therapeutic exercises    Myofascial Release  myofascial release and manual therapy to left upper arm, deltoid, and scapularis regions to decrease pain and fascial restrictions and increase joint ROM              OT Education - 06/09/19 1641    Education Details  red scapular theraband    Person(s) Educated   Patient    Methods  Explanation;Demonstration;Handout    Comprehension  Verbalized understanding;Returned demonstration       OT Short Term Goals - 06/03/19 1003      OT SHORT TERM GOAL #1   Title  Pt will be provided with and educated on HEP to improve functional use of LUE during ADL completion.    Time  4    Period  Weeks    Status  On-going    Target Date  07/02/19      OT SHORT TERM GOAL #2   Title  Pt will improve LUE P/ROM to WNL to improve ability to donn shirts with minimal compensatory strategies.    Time  4    Period  Weeks    Status  On-going      OT SHORT TERM GOAL #3   Title  Pt will increase LUE strength to 4-/5 to improve ability to reach for items in low overhead cabinets.    Status  On-going        OT Long Term Goals - 06/03/19 1003      OT LONG TERM GOAL #1   Title  Pt will achieve highest level of functioning using LUE as non-dominant during ADLs and work tasks.    Time  8    Period  Weeks    Status  On-going      OT LONG TERM GOAL #2   Title  Pt will decrease pain in LUE to 2/10 or less to improve ability to sleep comfortably at night.    Time  8    Period  Weeks    Status  On-going      OT LONG TERM GOAL #3   Title  Pt will decrease fascial restriction in LUE to minimal amounts or less to improve mobility required for functional reaching tasks.    Time  8    Period  Weeks    Status  On-going      OT LONG TERM GOAL #4   Title  Pt will increase LUE A/ROM to [facility] to improve ability to perform overhead reaching tasks at home and work.    Time  8    Period  Weeks    Status  On-going      OT LONG TERM GOAL #5   Title  Pt will improve LUE strength to 5/5 to increase ability to perform work tasks.    Time  8  Period  Weeks    Status  On-going            Plan - 06/09/19 1654    Clinical Impression Statement  A: Minimal fascial restrictions in LUE today, therefore no manual therapy completed. Continued with P/ROM and progressed to  A/ROM in supine, continued AA/ROM in standing for flexion and abduction. Added ball on wall for proximal shoulder strengthening and stability, added retraction with red theraband and updated HEP for scapular theraband. Pt with mod fatigue at end of session, achieving ROM 75% or greater with exercises. Verbal cuing for form and technique.    Body Structure / Function / Physical Skills  ADL;Endurance;UE functional use;Fascial restriction;Pain;Flexibility;ROM;IADL;Strength    Plan  P: Continue working to improve ROM and progress to all A/ROM as tolerated. Follow up on HEP.       Patient will benefit from skilled therapeutic intervention in order to improve the following deficits and impairments:   Body Structure / Function / Physical Skills: ADL, Endurance, UE functional use, Fascial restriction, Pain, Flexibility, ROM, IADL, Strength       Visit Diagnosis: 1. Acute pain of left shoulder   2. Other symptoms and signs involving the musculoskeletal system   3. Stiffness of left shoulder, not elsewhere classified       Problem List Patient Active Problem List   Diagnosis Date Noted  . Complete rotator cuff rupture of left shoulder 04/05/2019   Guadelupe Sabin, OTR/L  [phone] 06/09/2019, 4:56 PM  [place], [place], [address] Phone: [phone]   Fax:  [phone]  Name: Henry Middleton MRN: [id number] Date of Birth: 1981-10-21

## 2019-06-09 NOTE — Patient Instructions (Signed)

## 2019-06-13 ENCOUNTER — Ambulatory Visit (HOSPITAL_COMMUNITY): Payer: BC Managed Care – PPO | Attending: Orthopedic Surgery

## 2019-06-13 ENCOUNTER — Encounter (HOSPITAL_COMMUNITY): Payer: Self-pay

## 2019-06-13 ENCOUNTER — Other Ambulatory Visit: Payer: Self-pay

## 2019-06-13 DIAGNOSIS — M25612 Stiffness of left shoulder, not elsewhere classified: Secondary | ICD-10-CM

## 2019-06-13 DIAGNOSIS — R29898 Other symptoms and signs involving the musculoskeletal system: Secondary | ICD-10-CM

## 2019-06-13 DIAGNOSIS — M25512 Pain in left shoulder: Secondary | ICD-10-CM

## 2019-06-13 NOTE — Therapy (Signed)
[place] [facility] [address] [place], [place], [address] Phone: [phone]   Fax:  [phone]  Occupational Therapy Treatment  Patient Details  Name: Henry Middleton MRN: [id number] Date of Birth: 04/17/1981 Referring Provider (OT): Dr. Teryl LucyJoshua Landau   Encounter Date: 06/13/2019  OT End of Session - 06/13/19 1454    Visit Number  5    Number of Visits  16    Date for OT Re-Evaluation  08/01/19   mini-reassessment 06/30/2019   Authorization Type  [employer] PPO    Authorization Time Period  No visit limit    OT Start Time  1440    OT Stop Time  1515    OT Time Calculation (min)  35 min    Activity Tolerance  Patient tolerated treatment well    Behavior During Therapy  [facility] for tasks assessed/performed       Past Medical History:  Diagnosis Date  . Complete rotator cuff rupture of left shoulder 04/05/2019    Past Surgical History:  Procedure Laterality Date  . CORNEAL LACERATION REPAIR    . SHOULDER ARTHROSCOPY WITH BANKART REPAIR Left 04/05/2019   Procedure: SHOULDER ARTHROSCOPY WITH BANKART REPAIR;  Surgeon: Teryl LucyLandau, Joshua, MD;  Location: [facility];  Service: Orthopedics;  Laterality: Left;  . SHOULDER ARTHROSCOPY WITH ROTATOR CUFF REPAIR AND SUBACROMIAL DECOMPRESSION Left 04/05/2019   Procedure: LEFT SHOULDER ARTHROSCOPY WITH DEBRIDEMENT, ROTATOR CUFF REPAIR, SUBACROMIAL DECOMPRESSION;  Surgeon: Teryl LucyLandau, Joshua, MD;  Location: [facility];  Service: Orthopedics;  Laterality: Left;    There were no vitals filed for this visit.  Subjective Assessment - 06/13/19 1452    Subjective   S: It doesn't hurt until I rotate it back.    Currently in Pain?  No/denies         [facility] - 06/13/19 1452      Assessment   Medical Diagnosis  s/p left RTC repair with bankart       Precautions   Precautions  Shoulder    Type of Shoulder Precautions  Pt is 8 weeks out from sx. Per MDs standard  protocol pt can progress as tolerated at this time.                OT Treatments/Exercises (OP) - 06/13/19 1452      Exercises   Exercises  Shoulder      Shoulder Exercises: Supine   Protraction  PROM;5 reps;AROM;12 reps    Horizontal ABduction  PROM;5 reps;AROM;12 reps    External Rotation  PROM;5 reps;AROM;12 reps    Internal Rotation  PROM;5 reps;AROM;12 reps    Flexion  PROM;5 reps;AROM;12 reps    ABduction  PROM;5 reps;AROM;12 reps      Shoulder Exercises: Standing   Protraction  AROM;12 reps    Horizontal ABduction  AROM;12 reps    External Rotation  AROM;12 reps    Internal Rotation  AROM;12 reps    Flexion  AROM;12 reps    ABduction  AROM;12 reps    Extension  Theraband;12 reps    Theraband Level (Shoulder Extension)  Level 2 (Red)    Row  Theraband;12 reps    Theraband Level (Shoulder Row)  Level 2 (Red)    Retraction  Theraband;12 reps    Theraband Level (Shoulder Retraction)  Level 2 (Red)      Shoulder Exercises: ROM/Strengthening   Over Head Lace  1' seated    X to V Arms  5X  Proximal Shoulder Strengthening, Supine  12X each, no rest breaks    Proximal Shoulder Strengthening, Seated  12X each, no rest breaks    Ball on Wall  1' flexion 1' abduction    Other ROM/Strengthening Exercises  ball pass behind back for IR, behind head for er, 10X each, using green weighted ball      Manual Therapy   Manual Therapy  Myofascial release    Manual therapy comments  completed separately from therapeutic exercises    Myofascial Release  myofascial release and manual therapy to left upper arm, deltoid, and scapularis regions to decrease pain and fascial restrictions and increase joint ROM                OT Short Term Goals - 06/03/19 1003      OT SHORT TERM GOAL #1   Title  Pt will be provided with and educated on HEP to improve functional use of LUE during ADL completion.    Time  4    Period  Weeks    Status  On-going    Target Date  07/02/19       OT SHORT TERM GOAL #2   Title  Pt will improve LUE P/ROM to WNL to improve ability to donn shirts with minimal compensatory strategies.    Time  4    Period  Weeks    Status  On-going      OT SHORT TERM GOAL #3   Title  Pt will increase LUE strength to 4-/5 to improve ability to reach for items in low overhead cabinets.    Status  On-going        OT Long Term Goals - 06/03/19 1003      OT LONG TERM GOAL #1   Title  Pt will achieve highest level of functioning using LUE as non-dominant during ADLs and work tasks.    Time  8    Period  Weeks    Status  On-going      OT LONG TERM GOAL #2   Title  Pt will decrease pain in LUE to 2/10 or less to improve ability to sleep comfortably at night.    Time  8    Period  Weeks    Status  On-going      OT LONG TERM GOAL #3   Title  Pt will decrease fascial restriction in LUE to minimal amounts or less to improve mobility required for functional reaching tasks.    Time  8    Period  Weeks    Status  On-going      OT LONG TERM GOAL #4   Title  Pt will increase LUE A/ROM to [facility] to improve ability to perform overhead reaching tasks at home and work.    Time  8    Period  Weeks    Status  On-going      OT LONG TERM GOAL #5   Title  Pt will improve LUE strength to 5/5 to increase ability to perform work tasks.    Time  8    Period  Weeks    Status  On-going            Plan - 06/13/19 1524    Clinical Impression Statement  A: Pt was able to able to complete all shoulder exercises as A/ROM standing. Presents with LUE muscle fatigue due to weakness and decrease strength. VC for form and techniqu were provided as needed. Manual techniques were completed  to address fascial restrictions in the Left upper trapezius region. Pt reports that his HEP is going well. Verbal education provided to complete shoulder exercises standing as A/ROM versus with dowel rod at home.    Body Structure / Function / Physical Skills  ADL;Endurance;UE  functional use;Fascial restriction;Pain;Flexibility;ROM;IADL;Strength    Plan  P: Continue with A/ROM and shoulder/scapular stability exercises.    Consulted and Agree with Plan of Care  Patient       Patient will benefit from skilled therapeutic intervention in order to improve the following deficits and impairments:   Body Structure / Function / Physical Skills: ADL, Endurance, UE functional use, Fascial restriction, Pain, Flexibility, ROM, IADL, Strength       Visit Diagnosis: 1. Stiffness of left shoulder, not elsewhere classified   2. Other symptoms and signs involving the musculoskeletal system   3. Acute pain of left shoulder       Problem List Patient Active Problem List   Diagnosis Date Noted  . Complete rotator cuff rupture of left shoulder 04/05/2019   Ailene Ravel, OTR/L,CBIS  [phone]  06/13/2019, 3:27 PM  [place] [address] [place], [place], [address] Phone: [phone]   Fax:  [phone]  Name: Henry Middleton MRN: [id number] Date of Birth: [birth year]

## 2019-06-16 ENCOUNTER — Ambulatory Visit (HOSPITAL_COMMUNITY): Payer: BC Managed Care – PPO

## 2019-06-16 ENCOUNTER — Other Ambulatory Visit: Payer: Self-pay

## 2019-06-16 ENCOUNTER — Encounter (HOSPITAL_COMMUNITY): Payer: Self-pay

## 2019-06-16 DIAGNOSIS — M25512 Pain in left shoulder: Secondary | ICD-10-CM

## 2019-06-16 DIAGNOSIS — M25612 Stiffness of left shoulder, not elsewhere classified: Secondary | ICD-10-CM | POA: Diagnosis not present

## 2019-06-16 DIAGNOSIS — R29898 Other symptoms and signs involving the musculoskeletal system: Secondary | ICD-10-CM

## 2019-06-17 NOTE — Therapy (Signed)
[facility] [address] [place], [place], [address] Phone: [phone]   Fax:  [phone]  Occupational Therapy Treatment  Patient Details  Name: Henry Middleton MRN: [id number] Date of Birth: 06/06/1981 Referring Provider (OT): Dr. Teryl LucyJoshua Landau   Encounter Date: 06/16/2019  OT End of Session - 06/16/19 1725    Visit Number  6    Number of Visits  16    Date for OT Re-Evaluation  08/01/19   mini-reassessment 06/30/2019   Authorization Type  [employer] PPO    Authorization Time Period  No visit limit    OT Start Time  1635    OT Stop Time  1715    OT Time Calculation (min)  40 min    Activity Tolerance  Patient tolerated treatment well    Behavior During Therapy  [facility] for tasks assessed/performed       Past Medical History:  Diagnosis Date  . Complete rotator cuff rupture of left shoulder 04/05/2019    Past Surgical History:  Procedure Laterality Date  . CORNEAL LACERATION REPAIR    . SHOULDER ARTHROSCOPY WITH BANKART REPAIR Left 04/05/2019   Procedure: SHOULDER ARTHROSCOPY WITH BANKART REPAIR;  Surgeon: Teryl LucyLandau, Joshua, MD;  Location: [facility];  Service: Orthopedics;  Laterality: Left;  . SHOULDER ARTHROSCOPY WITH ROTATOR CUFF REPAIR AND SUBACROMIAL DECOMPRESSION Left 04/05/2019   Procedure: LEFT SHOULDER ARTHROSCOPY WITH DEBRIDEMENT, ROTATOR CUFF REPAIR, SUBACROMIAL DECOMPRESSION;  Surgeon: Teryl LucyLandau, Joshua, MD;  Location: [facility];  Service: Orthopedics;  Laterality: Left;    There were no vitals filed for this visit.  Subjective Assessment - 06/16/19 1646    Subjective   S: I've never been very flexible.    Currently in Pain?  No/denies         [facility] - 06/16/19 1646      Assessment   Medical Diagnosis  s/p left RTC repair with bankart       Precautions   Precautions  Shoulder    Type of Shoulder Precautions  Pt is 8 weeks out from sx. Per MDs standard protocol pt can  progress as tolerated at this time.                OT Treatments/Exercises (OP) - 06/16/19 1647      Exercises   Exercises  Shoulder      Shoulder Exercises: Supine   Protraction  PROM;5 reps;AROM;15 reps    Horizontal ABduction  PROM;5 reps;AROM;15 reps    External Rotation  PROM;5 reps;AROM;15 reps    Internal Rotation  PROM;5 reps;AROM;15 reps    Flexion  PROM;5 reps;AROM;15 reps    ABduction  PROM;5 reps;AROM;15 reps      Shoulder Exercises: Standing   Protraction  AROM;15 reps    Horizontal ABduction  AROM;15 reps    External Rotation  AROM;15 reps    Internal Rotation  AROM;15 reps    Flexion  AROM;15 reps    ABduction  AROM;15 reps    Extension  Theraband;15 reps    Theraband Level (Shoulder Extension)  Level 2 (Red)    Row  Theraband;15 reps    Theraband Level (Shoulder Row)  Level 2 (Red)    Retraction  Theraband;15 reps    Theraband Level (Shoulder Retraction)  Level 2 (Red)      Shoulder Exercises: ROM/Strengthening   UBE (Upper Arm Bike)  Level 20 2' forward 2' reverse   pace: 14.0-14.5   Over  Head Lace  1' seated    X to V Arms  10X A/ROM    Proximal Shoulder Strengthening, Supine  15X each, no rest breaks    Proximal Shoulder Strengthening, Seated  15X each, no rest breaks    Ball on Wall  1' flexion 1' abduction    Other ROM/Strengthening Exercises  ball pass behind back for IR, behind head for er, 10X each, using green weighted ball      Manual Therapy   Manual Therapy  Myofascial release    Manual therapy comments  completed separately from therapeutic exercises    Myofascial Release  myofascial release and manual therapy to left upper arm, deltoid, and scapularis regions to decrease pain and fascial restrictions and increase joint ROM                OT Short Term Goals - 06/03/19 1003      OT SHORT TERM GOAL #1   Title  Pt will be provided with and educated on HEP to improve functional use of LUE during ADL completion.    Time  4     Period  Weeks    Status  On-going    Target Date  07/02/19      OT SHORT TERM GOAL #2   Title  Pt will improve LUE P/ROM to WNL to improve ability to donn shirts with minimal compensatory strategies.    Time  4    Period  Weeks    Status  On-going      OT SHORT TERM GOAL #3   Title  Pt will increase LUE strength to 4-/5 to improve ability to reach for items in low overhead cabinets.    Status  On-going        OT Long Term Goals - 06/03/19 1003      OT LONG TERM GOAL #1   Title  Pt will achieve highest level of functioning using LUE as non-dominant during ADLs and work tasks.    Time  8    Period  Weeks    Status  On-going      OT LONG TERM GOAL #2   Title  Pt will decrease pain in LUE to 2/10 or less to improve ability to sleep comfortably at night.    Time  8    Period  Weeks    Status  On-going      OT LONG TERM GOAL #3   Title  Pt will decrease fascial restriction in LUE to minimal amounts or less to improve mobility required for functional reaching tasks.    Time  8    Period  Weeks    Status  On-going      OT LONG TERM GOAL #4   Title  Pt will increase LUE A/ROM to [facility] to improve ability to perform overhead reaching tasks at home and work.    Time  8    Period  Weeks    Status  On-going      OT LONG TERM GOAL #5   Title  Pt will improve LUE strength to 5/5 to increase ability to perform work tasks.    Time  8    Period  Weeks    Status  On-going            Plan - 06/17/19 2595    Clinical Impression Statement  A: Continued to focus on A/ROM supine and standing while increasing repetitions. Demonstrates decreased shoulder endurance due to scapular weakness.  Occassional breaks taken as needed. VC for form and technique. Manual techniques completed to address fascial restrictions.    Body Structure / Function / Physical Skills  ADL;Endurance;UE functional use;Fascial restriction;Pain;Flexibility;ROM;IADL;Strength    Plan  P: Continue to work on  increasing scapular/shoulder stability. Add therapy ball exercises.    Consulted and Agree with Plan of Care  Patient       Patient will benefit from skilled therapeutic intervention in order to improve the following deficits and impairments:   Body Structure / Function / Physical Skills: ADL, Endurance, UE functional use, Fascial restriction, Pain, Flexibility, ROM, IADL, Strength       Visit Diagnosis: 1. Stiffness of left shoulder, not elsewhere classified   2. Other symptoms and signs involving the musculoskeletal system   3. Acute pain of left shoulder       Problem List Patient Active Problem List   Diagnosis Date Noted  . Complete rotator cuff rupture of left shoulder 04/05/2019   Limmie PatriciaLaura , OTR/L,CBIS  [phone]  06/17/2019, 8:13 AM  [place] [facility] [address] [place], [place], [address] Phone: [phone]   Fax:  [phone]  Name: Henry Middleton MRN: [id number] Date of Birth: 12/25/1980

## 2019-06-21 ENCOUNTER — Other Ambulatory Visit: Payer: Self-pay

## 2019-06-21 ENCOUNTER — Encounter (HOSPITAL_COMMUNITY): Payer: Self-pay

## 2019-06-21 ENCOUNTER — Ambulatory Visit (HOSPITAL_COMMUNITY): Payer: BC Managed Care – PPO

## 2019-06-21 DIAGNOSIS — M25512 Pain in left shoulder: Secondary | ICD-10-CM

## 2019-06-21 DIAGNOSIS — M25612 Stiffness of left shoulder, not elsewhere classified: Secondary | ICD-10-CM

## 2019-06-21 DIAGNOSIS — R29898 Other symptoms and signs involving the musculoskeletal system: Secondary | ICD-10-CM

## 2019-06-21 NOTE — Therapy (Signed)
[place], [place], [address] Phone: [phone]   Fax:  [phone]  Occupational Therapy Treatment  Patient Details  Name: Henry Middleton MRN: [id number] Date of Birth: 06-22-1981 Referring Provider (OT): Dr. Marchia Bond   Encounter Date: 06/21/2019  OT End of Session - 06/21/19 1718    Visit Number  7    Number of Visits  16    Date for OT Re-Evaluation  08/01/19   mini-reassessment 06/30/2019   Authorization Type  [employer]    Authorization Time Period  No visit limit    OT Start Time  1634    OT Stop Time  1712    OT Time Calculation (min)  38 min    Activity Tolerance  Patient tolerated treatment well    Behavior During Therapy  [facility] for tasks assessed/performed       Past Medical History:  Diagnosis Date  . Complete rotator cuff rupture of left shoulder 04/05/2019    Past Surgical History:  Procedure Laterality Date  . CORNEAL LACERATION REPAIR    . SHOULDER ARTHROSCOPY WITH BANKART REPAIR Left 04/05/2019   Procedure: SHOULDER ARTHROSCOPY WITH BANKART REPAIR;  Surgeon: Marchia Bond, MD;  Location: [place];  Service: Orthopedics;  Laterality: Left;  . SHOULDER ARTHROSCOPY WITH ROTATOR CUFF REPAIR AND SUBACROMIAL DECOMPRESSION Left 04/05/2019   Procedure: LEFT SHOULDER ARTHROSCOPY WITH DEBRIDEMENT, ROTATOR CUFF REPAIR, SUBACROMIAL DECOMPRESSION;  Surgeon: Marchia Bond, MD;  Location: [place];  Service: Orthopedics;  Laterality: Left;    There were no vitals filed for this visit.  Subjective Assessment - 06/21/19 1654    Subjective   S: I"m back to work this week on light duty.    Currently in Pain?  No/denies         [facility] OT Assessment - 06/21/19 1655      Assessment   Medical Diagnosis  s/p left RTC repair with bankart       Precautions   Precautions  Shoulder    Type of Shoulder Precautions  Pt is 8 weeks out from sx. Per MDs standard  protocol pt can progress as tolerated at this time.                OT Treatments/Exercises (OP) - 06/21/19 1655      Exercises   Exercises  Shoulder      Shoulder Exercises: Supine   Protraction  PROM;5 reps    Horizontal ABduction  PROM;5 reps    External Rotation  PROM;5 reps    Internal Rotation  PROM;5 reps    Flexion  PROM;5 reps    ABduction  PROM;5 reps      Shoulder Exercises: Sidelying   External Rotation  AROM;15 reps    Internal Rotation  AROM;15 reps    Flexion  AROM;15 reps    ABduction  AROM;15 reps    Other Sidelying Exercises  protraction; A/ROM; 15X    Other Sidelying Exercises  Horizontal abduction; A/ROM; 15X      Shoulder Exercises: Therapy Ball   Other Therapy Ball Exercises  Green therapy ball: circles, flexion, chest press, diagonals 15X      Shoulder Exercises: ROM/Strengthening   Over Head Lace  1' seated    X to V Arms  12X A/ROM    Proximal Shoulder Strengthening, Seated  15X each, no rest breaks    Ball on Wall  1' flexion 1' abduction  Other ROM/Strengthening Exercises  ball pass behind back for IR, behind head for er, 10X each, using green weighted ball      Manual Therapy   Manual Therapy  Myofascial release    Manual therapy comments  completed separately from therapeutic exercises    Myofascial Release  myofascial release and manual therapy to left upper arm, deltoid, and scapularis regions to decrease pain and fascial restrictions and increase joint ROM                OT Short Term Goals - 06/03/19 1003      OT SHORT TERM GOAL #1   Title  Pt will be provided with and educated on HEP to improve functional use of LUE during ADL completion.    Time  4    Period  Weeks    Status  On-going    Target Date  07/02/19      OT SHORT TERM GOAL #2   Title  Pt will improve LUE P/ROM to WNL to improve ability to donn shirts with minimal compensatory strategies.    Time  4    Period  Weeks    Status  On-going      OT SHORT  TERM GOAL #3   Title  Pt will increase LUE strength to 4-/5 to improve ability to reach for items in low overhead cabinets.    Status  On-going        OT Long Term Goals - 06/03/19 1003      OT LONG TERM GOAL #1   Title  Pt will achieve highest level of functioning using LUE as non-dominant during ADLs and work tasks.    Time  8    Period  Weeks    Status  On-going      OT LONG TERM GOAL #2   Title  Pt will decrease pain in LUE to 2/10 or less to improve ability to sleep comfortably at night.    Time  8    Period  Weeks    Status  On-going      OT LONG TERM GOAL #3   Title  Pt will decrease fascial restriction in LUE to minimal amounts or less to improve mobility required for functional reaching tasks.    Time  8    Period  Weeks    Status  On-going      OT LONG TERM GOAL #4   Title  Pt will increase LUE A/ROM to [facility] to improve ability to perform overhead reaching tasks at home and work.    Time  8    Period  Weeks    Status  On-going      OT LONG TERM GOAL #5   Title  Pt will improve LUE strength to 5/5 to increase ability to perform work tasks.    Time  8    Period  Weeks    Status  On-going            Plan - 06/21/19 1718    Clinical Impression Statement  A: Added sidelying A/ROM to focus on scapular strength and stability. patient was provided with VC for form and technique as needed. Demonstrates muscle fatigue and weakness while require occasional rest breaks. No fascial restrictions noted this session during manual therapy.    Body Structure / Function / Physical Skills  ADL;Endurance;UE functional use;Fascial restriction;Pain;Flexibility;ROM;IADL;Strength    Plan  P: Mini reassessment. Progress to strengthening supine and standing if able to tolerate.  Consulted and Agree with Plan of Care  Patient       Patient will benefit from skilled therapeutic intervention in order to improve the following deficits and impairments:   Body Structure / Function /  Physical Skills: ADL, Endurance, UE functional use, Fascial restriction, Pain, Flexibility, ROM, IADL, Strength       Visit Diagnosis: 1. Acute pain of left shoulder   2. Other symptoms and signs involving the musculoskeletal system   3. Stiffness of left shoulder, not elsewhere classified       Problem List Patient Active Problem List   Diagnosis Date Noted  . Complete rotator cuff rupture of left shoulder 04/05/2019   Limmie PatriciaLaura Chanon Loney, OTR/L,CBIS  [phone]  06/21/2019, 5:20 PM  [place] [facility] [address] , [place], [address] Phone: [phone]   Fax:  [phone]  Name: Ranelle OysterJustin L Fratus MRN: [id number] Date of Birth: 09/18/1981

## 2019-06-23 ENCOUNTER — Other Ambulatory Visit: Payer: Self-pay

## 2019-06-23 ENCOUNTER — Ambulatory Visit (HOSPITAL_COMMUNITY): Payer: BC Managed Care – PPO

## 2019-06-23 ENCOUNTER — Encounter (HOSPITAL_COMMUNITY): Payer: Self-pay

## 2019-06-23 DIAGNOSIS — R29898 Other symptoms and signs involving the musculoskeletal system: Secondary | ICD-10-CM

## 2019-06-23 DIAGNOSIS — M25612 Stiffness of left shoulder, not elsewhere classified: Secondary | ICD-10-CM | POA: Diagnosis not present

## 2019-06-23 DIAGNOSIS — M25512 Pain in left shoulder: Secondary | ICD-10-CM

## 2019-06-24 NOTE — Therapy (Signed)
[facility] [address] [place], [place], [address] Phone: [phone]   Fax:  [phone]  Occupational Therapy Treatment Mini reassessment Patient Details  Name: Henry Middleton MRN: [id number] Date of Birth: 04/16/1981 Referring Provider (OT): Dr. Teryl LucyJoshua Landau   Encounter Date: 06/23/2019  OT End of Session - 06/23/19 1656    Visit Number  8    Number of Visits  16    Date for OT Re-Evaluation  08/01/19      Authorization Type  [employer] PPO    Authorization Time Period  No visit limit    OT Start Time  1636    OT Stop Time  1715    OT Time Calculation (min)  39 min    Activity Tolerance  Patient tolerated treatment well    Behavior During Therapy  [facility] for tasks assessed/performed       Past Medical History:  Diagnosis Date  . Complete rotator cuff rupture of left shoulder 04/05/2019    Past Surgical History:  Procedure Laterality Date  . CORNEAL LACERATION REPAIR    . SHOULDER ARTHROSCOPY WITH BANKART REPAIR Left 04/05/2019   Procedure: SHOULDER ARTHROSCOPY WITH BANKART REPAIR;  Surgeon: Teryl LucyLandau, Joshua, MD;  Location: [facility];  Service: Orthopedics;  Laterality: Left;  . SHOULDER ARTHROSCOPY WITH ROTATOR CUFF REPAIR AND SUBACROMIAL DECOMPRESSION Left 04/05/2019   Procedure: LEFT SHOULDER ARTHROSCOPY WITH DEBRIDEMENT, ROTATOR CUFF REPAIR, SUBACROMIAL DECOMPRESSION;  Surgeon: Teryl LucyLandau, Joshua, MD;  Location: [facility];  Service: Orthopedics;  Laterality: Left;    There were no vitals filed for this visit.  Subjective Assessment - 06/24/19 0821    Subjective   S: I can pick up things and keep them close but I can't if I'm going to lift it to my shoulder or above.    Currently in Pain?  No/denies         [facility] - 06/23/19 1636      Assessment   Medical Diagnosis  s/p left RTC repair with bankart     Referring Provider (OT)  Dr. Teryl LucyJoshua Landau      Precautions   Precautions  Shoulder    Type of Shoulder Precautions  Pt is 8 weeks out from sx. Per MDs standard protocol pt can progress as tolerated at this time.       ROM / Strength   AROM / PROM / Strength  AROM;PROM;Strength      AROM   Overall AROM Comments  Assessed seated, er/IR adducted    AROM Assessment Site  Shoulder    Right/Left Shoulder  Left    Left Shoulder Flexion  150 Degrees   previous: 150   Left Shoulder ABduction  163 Degrees   previous: 81   Left Shoulder Internal Rotation  90 Degrees   previous: same   Left Shoulder External Rotation  65 Degrees   previous: 0     PROM   Overall PROM Comments  Assessed supine, er/IR adducted    PROM Assessment Site  Shoulder    Right/Left Shoulder  Left    Left Shoulder Flexion  155 Degrees   previous: 129   Left Shoulder ABduction  180 Degrees   previous: 122   Left Shoulder Internal Rotation  90 Degrees   preivous: same   Left Shoulder External Rotation  50 Degrees   previous: 20     Strength   Overall Strength Comments  Assessed seated, er/IR adducted  Strength Assessment Site  Shoulder    Right/Left Shoulder  Left    Left Shoulder Flexion  4-/5   previous: 3/5   Left Shoulder ABduction  3+/5   previous: 3/5   Left Shoulder Internal Rotation  5/5   previous: 3+/5   Left Shoulder External Rotation  4-/5   previous: 3-/5              OT Treatments/Exercises (OP) - 06/23/19 1644      Exercises   Exercises  Shoulder      Shoulder Exercises: Supine   Protraction  PROM;5 reps    Horizontal ABduction  PROM;5 reps;Strengthening;10 reps    Horizontal ABduction Weight (lbs)  3    External Rotation  PROM;5 reps;Strengthening;10 reps    External Rotation Weight (lbs)  3    Internal Rotation  PROM;5 reps;Strengthening;10 reps    Internal Rotation Weight (lbs)  3    Flexion  PROM;5 reps;Strengthening;10 reps    Shoulder Flexion Weight (lbs)  2    ABduction  PROM;5 reps;Strengthening;10 reps    Shoulder  ABduction Weight (lbs)  3      Shoulder Exercises: Standing   Protraction  Strengthening;10 reps    Protraction Weight (lbs)  3    Horizontal ABduction  Strengthening;10 reps    Horizontal ABduction Weight (lbs)  3    External Rotation  Strengthening;10 reps    External Rotation Weight (lbs)  3    Internal Rotation  Strengthening;10 reps    Internal Rotation Weight (lbs)  3    Flexion  Strengthening;10 reps    Shoulder Flexion Weight (lbs)  3    ABduction  Strengthening;10 reps    Shoulder ABduction Weight (lbs)  3    Extension  Theraband;12 reps    Theraband Level (Shoulder Extension)  Level 3 (Green)    Retraction  Theraband;12 reps    Theraband Level (Shoulder Retraction)  Level 3 (Green)      Shoulder Exercises: ROM/Strengthening   Over Head Lace  2' seated with 1# wrist weight    X to V Arms  10X with 1#    Proximal Shoulder Strengthening, Supine  12X each. no rest break    Proximal Shoulder Strengthening, Seated  10X with 3# no rest breaks    Ball on Wall  1' flexion 1' abduction    Other ROM/Strengthening Exercises  ball pass behind back for IR, behind head for er, 10X each, using red weighted ball               OT Short Term Goals - 06/03/19 1003      OT SHORT TERM GOAL #1   Title  Pt will be provided with and educated on HEP to improve functional use of LUE during ADL completion.    Time  4    Period  Weeks    Status  On-going    Target Date  07/02/19      OT SHORT TERM GOAL #2   Title  Pt will improve LUE P/ROM to WNL to improve ability to donn shirts with minimal compensatory strategies.    Time  4    Period  Weeks    Status  On-going      OT SHORT TERM GOAL #3   Title  Pt will increase LUE strength to 4-/5 to improve ability to reach for items in low overhead cabinets.    Status  On-going        OT Long Term  Goals - 06/03/19 1003      OT LONG TERM GOAL #1   Title  Pt will achieve highest level of functioning using LUE as non-dominant during  ADLs and work tasks.    Time  8    Period  Weeks    Status  On-going      OT LONG TERM GOAL #2   Title  Pt will decrease pain in LUE to 2/10 or less to improve ability to sleep comfortably at night.    Time  8    Period  Weeks    Status  On-going      OT LONG TERM GOAL #3   Title  Pt will decrease fascial restriction in LUE to minimal amounts or less to improve mobility required for functional reaching tasks.    Time  8    Period  Weeks    Status  On-going      OT LONG TERM GOAL #4   Title  Pt will increase LUE A/ROM to [facility] to improve ability to perform overhead reaching tasks at home and work.    Time  8    Period  Weeks    Status  On-going      OT LONG TERM GOAL #5   Title  Pt will improve LUE strength to 5/5 to increase ability to perform work tasks.    Time  8    Period  Weeks    Status  On-going            Plan - 06/24/19 0816    Clinical Impression Statement  A: Mini reassessment completed this date. patient demonstrates increased ROM and strength since last progress note. He continues to have increased difficulty completing any reaching tasks above shoulder level. He reports that he feel comfident with light weight lifting activities when he is able to keep the item close to him with elbow bent at 90 degrees. He is not confident with his strength when his shoulders are extended. Progressed to lightweight strengthening this session with 3# hand weights. Pt performed well with VC for form and technique.    Plan  P: Continue with strengthening and ROM. Add shoulder stretches.    Consulted and Agree with Plan of Care  Patient       Patient will benefit from skilled therapeutic intervention in order to improve the following deficits and impairments:           Visit Diagnosis: 1. Acute pain of left shoulder   2. Other symptoms and signs involving the musculoskeletal system   3. Stiffness of left shoulder, not elsewhere classified       Problem List Patient  Active Problem List   Diagnosis Date Noted  . Complete rotator cuff rupture of left shoulder 04/05/2019   Limmie PatriciaLaura Cieara Stierwalt, OTR/L,CBIS  [phone]  06/24/2019, 8:41 AM  [facility] [address] [place], [place], [address] Phone: [phone]   Fax:  [phone]  Name: Henry OysterJustin L Middleton MRN: [id number] Date of Birth: 10/09/1981

## 2019-06-28 ENCOUNTER — Ambulatory Visit (HOSPITAL_COMMUNITY): Payer: BC Managed Care – PPO

## 2019-06-28 ENCOUNTER — Other Ambulatory Visit: Payer: Self-pay

## 2019-06-28 DIAGNOSIS — R29898 Other symptoms and signs involving the musculoskeletal system: Secondary | ICD-10-CM

## 2019-06-28 DIAGNOSIS — M25612 Stiffness of left shoulder, not elsewhere classified: Secondary | ICD-10-CM | POA: Diagnosis not present

## 2019-06-28 DIAGNOSIS — M25512 Pain in left shoulder: Secondary | ICD-10-CM

## 2019-06-28 NOTE — Patient Instructions (Signed)
Complete the following exercises 2-3 times a day.  Doorway Stretch  Place each hand opposite each other on the doorway. (You can change where you feel the stretch by moving arms higher or lower.) Step through with one foot and bend front knee until a stretch is felt and hold. Hold for __30___ seconds. Repeat __1__times.     Towel Internal Rotation Stretch  Hold a towel in both hands. Bring one hand behind your body and with the other, reach behind your neck and use the towel to gently pull the other hand behind the back until you feel a stretch in the shoulder. Hold for 30 seconds. Complete 1 time.      Wall Flexion  Slide your arm up the wall or door frame until a stretch is felt in your shoulder . Hold for 30 seconds. Complete 1 times     Shoulder Abduction Stretch  Stand side ways by a wall with affected up on wall. Gently step in toward wall to feel stretch. Hold for 30 seconds. Complete 1 times.

## 2019-06-28 NOTE — Therapy (Signed)
[place], [place], [address] Phone: [phone]   Fax:  [phone]  Occupational Therapy Treatment  Patient Details  Name: Henry Middleton MRN: [id number] Date of Birth: Feb 25, 1981 Referring Provider (OT): Dr. Marchia Bond   Encounter Date: 06/28/2019  OT End of Session - 06/28/19 1222    Visit Number  9    Number of Visits  16    Date for OT Re-Evaluation  08/01/19    Authorization Type  [employer] PPO    Authorization Time Period  No visit limit    OT Start Time  1135    OT Stop Time  1213    OT Time Calculation (min)  38 min    Activity Tolerance  Patient tolerated treatment well    Behavior During Therapy  [facility] for tasks assessed/performed       Past Medical History:  Diagnosis Date  . Complete rotator cuff rupture of left shoulder 04/05/2019    Past Surgical History:  Procedure Laterality Date  . CORNEAL LACERATION REPAIR    . SHOULDER ARTHROSCOPY WITH BANKART REPAIR Left 04/05/2019   Procedure: SHOULDER ARTHROSCOPY WITH BANKART REPAIR;  Surgeon: Marchia Bond, MD;  Location: [place];  Service: Orthopedics;  Laterality: Left;  . SHOULDER ARTHROSCOPY WITH ROTATOR CUFF REPAIR AND SUBACROMIAL DECOMPRESSION Left 04/05/2019   Procedure: LEFT SHOULDER ARTHROSCOPY WITH DEBRIDEMENT, ROTATOR CUFF REPAIR, SUBACROMIAL DECOMPRESSION;  Surgeon: Marchia Bond, MD;  Location: [place];  Service: Orthopedics;  Laterality: Left;    There were no vitals filed for this visit.  Subjective Assessment - 06/28/19 1203    Subjective   S: No issues. I was just a little sore after last time but nothing bad.    Currently in Pain?  No/denies         [facility] OT Assessment - 06/28/19 1227      Assessment   Medical Diagnosis  s/p left RTC repair with bankart       Precautions   Precautions  Shoulder    Type of Shoulder Precautions  Pt is 8 weeks out from sx. Per MDs standard  protocol pt can progress as tolerated at this time.                OT Treatments/Exercises (OP) - 06/28/19 1156      ADLs   Overall ADLs  --      Exercises   Exercises  Shoulder      Shoulder Exercises: Standing   Protraction  Strengthening;10 reps    Protraction Weight (lbs)  3    Horizontal ABduction  Strengthening;10 reps    Horizontal ABduction Weight (lbs)  3    External Rotation  Strengthening;10 reps    External Rotation Weight (lbs)  3    Internal Rotation  Strengthening;10 reps    Internal Rotation Weight (lbs)  3    Flexion  Strengthening;10 reps    Shoulder Flexion Weight (lbs)  3    ABduction  Strengthening;10 reps    Shoulder ABduction Weight (lbs)  3      Shoulder Exercises: ROM/Strengthening   Over Head Lace  2' seated with 1# wrist weight    X to V Arms  10X with 1#    Proximal Shoulder Strengthening, Supine  12X with 3# no rest breaks    Proximal Shoulder Strengthening, Seated  10X with 3# no rest breaks    Ball on Wall  1' flexion 1'  abduction    Other ROM/Strengthening Exercises  ball pass behind back for IR, behind head for er, 10X each, using red weighted ball      Shoulder Exercises: Stretch   Corner Stretch  1 rep;30 seconds   doorway   Internal Rotation Stretch  1 rep   30 seconds; towel vertical   Wall Stretch - Flexion  1 rep;30 seconds    Wall Stretch - ABduction  1 rep;30 seconds               OT Short Term Goals - 06/03/19 1003      OT SHORT TERM GOAL #1   Title  Pt will be provided with and educated on HEP to improve functional use of LUE during ADL completion.    Time  4    Period  Weeks    Status  On-going    Target Date  07/02/19      OT SHORT TERM GOAL #2   Title  Pt will improve LUE P/ROM to WNL to improve ability to donn shirts with minimal compensatory strategies.    Time  4    Period  Weeks    Status  On-going      OT SHORT TERM GOAL #3   Title  Pt will increase LUE strength to 4-/5 to improve ability to  reach for items in low overhead cabinets.    Status  On-going        OT Long Term Goals - 06/03/19 1003      OT LONG TERM GOAL #1   Title  Pt will achieve highest level of functioning using LUE as non-dominant during ADLs and work tasks.    Time  8    Period  Weeks    Status  On-going      OT LONG TERM GOAL #2   Title  Pt will decrease pain in LUE to 2/10 or less to improve ability to sleep comfortably at night.    Time  8    Period  Weeks    Status  On-going      OT LONG TERM GOAL #3   Title  Pt will decrease fascial restriction in LUE to minimal amounts or less to improve mobility required for functional reaching tasks.    Time  8    Period  Weeks    Status  On-going      OT LONG TERM GOAL #4   Title  Pt will increase LUE A/ROM to [facility] to improve ability to perform overhead reaching tasks at home and work.    Time  8    Period  Weeks    Status  On-going      OT LONG TERM GOAL #5   Title  Pt will improve LUE strength to 5/5 to increase ability to perform work tasks.    Time  8    Period  Weeks    Status  On-going            Plan - 06/28/19 1222    Clinical Impression Statement  A: Added shoulder stretches to HEP with patient completing in session. pt reports increased sore in anterior portion of shoulder while during stretches. Education was provided for adjusting intensity of the stretches while completing.    Body Structure / Function / Physical Skills  ADL;Endurance;UE functional use;Fascial restriction;Pain;Flexibility;ROM;IADL;Strength    Plan  P: Follow up on shoulder stretches. Complete overhead reaching task for shoulder indurance (curtain rod).    Consulted  and Agree with Plan of Care  Patient       Patient will benefit from skilled therapeutic intervention in order to improve the following deficits and impairments:   Body Structure / Function / Physical Skills: ADL, Endurance, UE functional use, Fascial restriction, Pain, Flexibility, ROM, IADL,  Strength       Visit Diagnosis: 1. Stiffness of left shoulder, not elsewhere classified   2. Other symptoms and signs involving the musculoskeletal system   3. Acute pain of left shoulder       Problem List Patient Active Problem List   Diagnosis Date Noted  . Complete rotator cuff rupture of left shoulder 04/05/2019   Limmie PatriciaLaura Micheale Schlack, OTR/L,CBIS  [phone]  06/28/2019, 12:27 PM  [facility] [address] [place], [place], [address] Phone: [phone]   Fax:  [phone]  Name: Henry Middleton MRN: [id number] Date of Birth: 04/11/1981

## 2019-06-30 ENCOUNTER — Encounter (HOSPITAL_COMMUNITY): Payer: Self-pay

## 2019-06-30 ENCOUNTER — Ambulatory Visit (HOSPITAL_COMMUNITY): Payer: BC Managed Care – PPO

## 2019-06-30 ENCOUNTER — Other Ambulatory Visit: Payer: Self-pay

## 2019-06-30 DIAGNOSIS — M25612 Stiffness of left shoulder, not elsewhere classified: Secondary | ICD-10-CM

## 2019-06-30 DIAGNOSIS — M25512 Pain in left shoulder: Secondary | ICD-10-CM

## 2019-06-30 DIAGNOSIS — R29898 Other symptoms and signs involving the musculoskeletal system: Secondary | ICD-10-CM

## 2019-06-30 NOTE — Therapy (Signed)
[facility] [address] [place], [place], [address] Phone: [phone]   Fax:  [phone]  Occupational Therapy Treatment  Patient Details  Name: Henry Middleton MRN: [id number] Date of Birth: 09/19/1981 Referring Provider (OT): Dr. Teryl LucyJoshua Landau   Encounter Date: 06/30/2019  OT End of Session - 06/30/19 1427    Visit Number  10    Number of Visits  16    Date for OT Re-Evaluation  08/01/19    Authorization Type  [employer] PPO    Authorization Time Period  No visit limit    OT Start Time  1333    OT Stop Time  1415    OT Time Calculation (min)  42 min    Activity Tolerance  Patient tolerated treatment well    Behavior During Therapy  [facility] assessed/performed       Past Medical History:  Diagnosis Date  . Complete rotator cuff rupture of left shoulder 04/05/2019    Past Surgical History:  Procedure Laterality Date  . CORNEAL LACERATION REPAIR    . SHOULDER ARTHROSCOPY WITH BANKART REPAIR Left 04/05/2019   Procedure: SHOULDER ARTHROSCOPY WITH BANKART REPAIR;  Surgeon: Teryl LucyLandau, Joshua, MD;  Location: [facility];  Service: Orthopedics;  Laterality: Left;  . SHOULDER ARTHROSCOPY WITH ROTATOR CUFF REPAIR AND SUBACROMIAL DECOMPRESSION Left 04/05/2019   Procedure: LEFT SHOULDER ARTHROSCOPY WITH DEBRIDEMENT, ROTATOR CUFF REPAIR, SUBACROMIAL DECOMPRESSION;  Surgeon: Teryl LucyLandau, Joshua, MD;  Location: [facility];  Service: Orthopedics;  Laterality: Left;    There were no vitals filed for this visit.  Subjective Assessment - 06/30/19 1344    Subjective   S: I try to do the stretches at the end of the exercises.    Currently in Pain?  No/denies         [facility] - 06/30/19 1345      Assessment   Medical Diagnosis  s/p left RTC repair with bankart       Precautions   Precautions  Shoulder    Type of Shoulder Precautions  Pt is 8 weeks out from sx. Per MDs standard protocol pt can  progress as tolerated at this time.                OT Treatments/Exercises (OP) - 06/30/19 1345      Exercises   Exercises  Shoulder      Shoulder Exercises: Supine   Protraction  PROM;5 reps    Horizontal ABduction  PROM;5 reps;Strengthening;10 reps    External Rotation  PROM;5 reps;Strengthening;10 reps    Internal Rotation  PROM;5 reps;Strengthening;10 reps    Flexion  PROM;5 reps;Strengthening;10 reps    ABduction  PROM;5 reps;Strengthening;10 reps      Shoulder Exercises: Standing   Protraction  Strengthening;12 reps;Theraband;10 reps    Theraband Level (Shoulder Protraction)  Level 3 (Green)    Protraction Weight (lbs)  3    Horizontal ABduction  Strengthening;10 reps    Horizontal ABduction Weight (lbs)  3    External Rotation  Strengthening;12 reps;Theraband;10 reps    Theraband Level (Shoulder External Rotation)  Level 3 (Green)    External Rotation Weight (lbs)  3    Internal Rotation  Strengthening;12 reps;Theraband;10 reps    Theraband Level (Shoulder Internal Rotation)  Level 3 (Green)    Internal Rotation Weight (lbs)  3    Flexion  Strengthening;12 reps    Shoulder Flexion Weight (lbs)  3    ABduction  Strengthening;10 reps    Shoulder ABduction Weight (lbs)  3    Extension  Theraband;12 reps    Theraband Level (Shoulder Extension)  Level 3 (Green)    Retraction  Theraband;12 reps    Theraband Level (Shoulder Retraction)  Level 3 (Green)      Shoulder Exercises: Therapy Diona Foley   Other Therapy Ball Exercises  Green therapy ball: circles, flexion, chest press, diagonals 12X      Shoulder Exercises: ROM/Strengthening   UBE (Upper Arm Bike)  Level 20 2' forward 2' reverse    X to V Arms  10X with 1#    Proximal Shoulder Strengthening, Seated  10X with 3# no rest breaks    Ball on Wall  1' flexion 1' abduction    Other ROM/Strengthening Exercises  ball pass behind back for IR, behind head for er, 10X each, using red weighted ball      Functional  Reaching Activities   High Level  Pt completed high level functional reaching task while removing shower curtain rod and then replacing it with moderate amount of difficulty due to shoulder fatigue. Completed activity in 5 minutes                OT Short Term Goals - 06/03/19 1003      OT SHORT TERM GOAL #1   Title  Pt will be provided with and educated on HEP to improve functional use of LUE during ADL completion.    Time  4    Period  Weeks    Status  On-going    Target Date  07/02/19      OT SHORT TERM GOAL #2   Title  Pt will improve LUE P/ROM to WNL to improve ability to donn shirts with minimal compensatory strategies.    Time  4    Period  Weeks    Status  On-going      OT SHORT TERM GOAL #3   Title  Pt will increase LUE strength to 4-/5 to improve ability to reach for items in low overhead cabinets.    Status  On-going        OT Long Term Goals - 06/03/19 1003      OT LONG TERM GOAL #1   Title  Pt will achieve highest level of functioning using LUE as non-dominant during ADLs and work tasks.    Time  8    Period  Weeks    Status  On-going      OT LONG TERM GOAL #2   Title  Pt will decrease pain in LUE to 2/10 or less to improve ability to sleep comfortably at night.    Time  8    Period  Weeks    Status  On-going      OT LONG TERM GOAL #3   Title  Pt will decrease fascial restriction in LUE to minimal amounts or less to improve mobility required for functional reaching tasks.    Time  8    Period  Weeks    Status  On-going      OT LONG TERM GOAL #4   Title  Pt will increase LUE A/ROM to [facility] to improve ability to perform overhead reaching tasks at home and work.    Time  8    Period  Weeks    Status  On-going      OT LONG TERM GOAL #5   Title  Pt will improve LUE strength to 5/5 to increase  ability to perform work tasks.    Time  8    Period  Weeks    Status  On-going            Plan - 06/30/19 1428    Clinical Impression Statement  A:  Patient demonstrated increased fatigue with any overhead and above shoulder level activities while working on shoulder/scapular stability. patient reports that he did more physical activity at work today which is causing him to fatigue more often.    Body Structure / Function / Physical Skills  ADL;Endurance;UE functional use;Fascial restriction;Pain;Flexibility;ROM;IADL;Strength    Plan  P: Continue to work on shoulder and scapular stability.    Consulted and Agree with Plan of Care  Patient       Patient will benefit from skilled therapeutic intervention in order to improve the following deficits and impairments:   Body Structure / Function / Physical Skills: ADL, Endurance, UE functional use, Fascial restriction, Pain, Flexibility, ROM, IADL, Strength       Visit Diagnosis: Other symptoms and signs involving the musculoskeletal system  Acute pain of left shoulder  Stiffness of left shoulder, not elsewhere classified    Problem List Patient Active Problem List   Diagnosis Date Noted  . Complete rotator cuff rupture of left shoulder 04/05/2019   Limmie PatriciaLaura Mandolin Falwell, OTR/L,CBIS  [phone]  06/30/2019, 2:30 PM  [facility] [address] [place], [place], [address] Phone: [phone]   Fax:  [phone]  Name: Henry Middleton MRN: [id number] Date of Birth: 12/25/1980

## 2019-07-04 ENCOUNTER — Ambulatory Visit (HOSPITAL_COMMUNITY): Payer: BC Managed Care – PPO | Admitting: Occupational Therapy

## 2019-07-04 ENCOUNTER — Encounter (HOSPITAL_COMMUNITY): Payer: Self-pay | Admitting: Occupational Therapy

## 2019-07-04 ENCOUNTER — Other Ambulatory Visit: Payer: Self-pay

## 2019-07-04 DIAGNOSIS — M25612 Stiffness of left shoulder, not elsewhere classified: Secondary | ICD-10-CM | POA: Diagnosis not present

## 2019-07-04 DIAGNOSIS — R29898 Other symptoms and signs involving the musculoskeletal system: Secondary | ICD-10-CM

## 2019-07-04 DIAGNOSIS — M25512 Pain in left shoulder: Secondary | ICD-10-CM

## 2019-07-04 NOTE — Therapy (Signed)
[facility] [address] [place], [place], [address] Phone: [phone]   Fax:  [phone]  Occupational Therapy Treatment  Patient Details  Name: Henry OysterJustin Middleton Middleton MRN: [id number] Date of Birth: 12/28/1980 Referring Provider (OT): Dr. Teryl LucyJoshua Landau   Encounter Date: 07/04/2019  OT End of Session - 07/04/19 1211    Visit Number  11    Number of Visits  16    Date for OT Re-Evaluation  08/01/19    Authorization Type  [employer] PPO    Authorization Time Period  No visit limit    OT Start Time  1130    OT Stop Time  1210    OT Time Calculation (min)  40 min    Activity Tolerance  Patient tolerated treatment well    Behavior During Therapy  [facility] for tasks assessed/performed       Past Medical History:  Diagnosis Date  . Complete rotator cuff rupture of left shoulder 04/05/2019    Past Surgical History:  Procedure Laterality Date  . CORNEAL LACERATION REPAIR    . SHOULDER ARTHROSCOPY WITH BANKART REPAIR Left 04/05/2019   Procedure: SHOULDER ARTHROSCOPY WITH BANKART REPAIR;  Surgeon: Teryl LucyLandau, Joshua, MD;  Location: [facility];  Service: Orthopedics;  Laterality: Left;  . SHOULDER ARTHROSCOPY WITH ROTATOR CUFF REPAIR AND SUBACROMIAL DECOMPRESSION Left 04/05/2019   Procedure: LEFT SHOULDER ARTHROSCOPY WITH DEBRIDEMENT, ROTATOR CUFF REPAIR, SUBACROMIAL DECOMPRESSION;  Surgeon: Teryl LucyLandau, Joshua, MD;  Location: [facility];  Service: Orthopedics;  Laterality: Left;    There were no vitals filed for this visit.  Subjective Assessment - 07/04/19 1133    Subjective   S: I was sore yesterday.    Currently in Pain?  No/denies         [facility] - 07/04/19 1132      Assessment   Medical Diagnosis  s/p left RTC repair with bankart       Precautions   Precautions  Shoulder    Type of Shoulder Precautions  Pt is 8 weeks out from sx. Per MDs standard protocol pt can progress as tolerated at this time.                 OT Treatments/Exercises (OP) - 07/04/19 1133      Exercises   Exercises  Shoulder      Shoulder Exercises: Supine   Protraction  PROM;5 reps;Strengthening;10 reps    Protraction Weight (lbs)  3    Horizontal ABduction  PROM;5 reps;Strengthening;10 reps    Horizontal ABduction Weight (lbs)  3    External Rotation  PROM;5 reps;Strengthening;10 reps    External Rotation Weight (lbs)  3    Internal Rotation  PROM;5 reps;Strengthening;10 reps    Internal Rotation Weight (lbs)  3    Flexion  PROM;5 reps;Strengthening;10 reps    Shoulder Flexion Weight (lbs)  3    ABduction  PROM;5 reps;Strengthening;10 reps    Shoulder ABduction Weight (lbs)  3      Shoulder Exercises: Sidelying   External Rotation  Theraband;10 reps    Theraband Level (Shoulder External Rotation)  Level 3 (Green)    Flexion  Theraband;10 reps    Theraband Level (Shoulder Flexion)  Level 3 (Green)    ABduction  Theraband;5 reps    Theraband Level (Shoulder ABduction)  Level 3 (Green)    Other Sidelying Exercises  protraction; green theraband, 10X      Shoulder Exercises: Standing   Protraction  Strengthening;12 reps;Theraband;10 reps    Theraband Level (Shoulder Protraction)  Level 3 (Green)    Protraction Weight (lbs)  3    Horizontal ABduction  Strengthening;Theraband;10 reps    Theraband Level (Shoulder Horizontal ABduction)  Level 3 (Green)    Horizontal ABduction Weight (lbs)  3    External Rotation  Strengthening;12 reps;Theraband;10 reps    Theraband Level (Shoulder External Rotation)  Level 3 (Green)    External Rotation Weight (lbs)  3    Internal Rotation  Strengthening;12 reps;Theraband;10 reps    Theraband Level (Shoulder Internal Rotation)  Level 3 (Green)    Internal Rotation Weight (lbs)  3    Flexion  Strengthening;12 reps    Shoulder Flexion Weight (lbs)  3    ABduction  Strengthening;10 reps    Shoulder ABduction Weight (lbs)  3    Retraction  Theraband;12 reps     Theraband Level (Shoulder Retraction)  Level 3 (Green)      Shoulder Exercises: Therapy Diona Foley   Other Therapy Ball Exercises  Green therapy ball: circles, flexion, chest press, diagonals 12X      Shoulder Exercises: ROM/Strengthening   UBE (Upper Arm Bike)  Level 20 2' forward 2' reverse    Over Head Lace  2' seated with 2# wrist weight    X to V Arms  12X with 1#    Proximal Shoulder Strengthening, Supine  12X with 3# no rest breaks    Ball on Wall  1' flexion 1' abduction red ball    Other ROM/Strengthening Exercises  ball pass behind back for IR, behind head for er, 10X each, using red weighted ball    Other ROM/Strengthening Exercises  arnold press, 1#, 5X               OT Short Term Goals - 06/03/19 1003      OT SHORT TERM GOAL #1   Title  Pt will be provided with and educated on HEP to improve functional use of LUE during ADL completion.    Time  4    Period  Weeks    Status  On-going    Target Date  07/02/19      OT SHORT TERM GOAL #2   Title  Pt will improve LUE P/ROM to WNL to improve ability to donn shirts with minimal compensatory strategies.    Time  4    Period  Weeks    Status  On-going      OT SHORT TERM GOAL #3   Title  Pt will increase LUE strength to 4-/5 to improve ability to reach for items in low overhead cabinets.    Status  On-going        OT Long Term Goals - 06/03/19 1003      OT LONG TERM GOAL #1   Title  Pt will achieve highest level of functioning using LUE as non-dominant during ADLs and work tasks.    Time  8    Period  Weeks    Status  On-going      OT LONG TERM GOAL #2   Title  Pt will decrease pain in LUE to 2/10 or less to improve ability to sleep comfortably at night.    Time  8    Period  Weeks    Status  On-going      OT LONG TERM GOAL #3   Title  Pt will decrease fascial restriction in LUE to minimal amounts or less to improve mobility required  for functional reaching tasks.    Time  8    Period  Weeks    Status   On-going      OT LONG TERM GOAL #4   Title  Pt will increase LUE A/ROM to [facility] to improve ability to perform overhead reaching tasks at home and work.    Time  8    Period  Weeks    Status  On-going      OT LONG TERM GOAL #5   Title  Pt will improve LUE strength to 5/5 to increase ability to perform work tasks.    Time  8    Period  Weeks    Status  On-going            Plan - 07/04/19 1212    Clinical Impression Statement  A: Continued working on shoulder strength and stability exercises this session, added green theraband in sidelying today. Added arnold press in standing, mod difficulty with this exercise. Pt with continued fatigue during exericses above shoulder level. Verbal cuing for form and technique.    Body Structure / Function / Physical Skills  ADL;Endurance;UE functional use;Fascial restriction;Pain;Flexibility;ROM;IADL;Strength    Plan  P: Continue with shoulder/scapular strength and stability, add bodycraft press with low weight-1# plate       Patient will benefit from skilled therapeutic intervention in order to improve the following deficits and impairments:   Body Structure / Function / Physical Skills: ADL, Endurance, UE functional use, Fascial restriction, Pain, Flexibility, ROM, IADL, Strength       Visit Diagnosis: Other symptoms and signs involving the musculoskeletal system  Acute pain of left shoulder  Stiffness of left shoulder, not elsewhere classified    Problem List Patient Active Problem List   Diagnosis Date Noted  . Complete rotator cuff rupture of left shoulder 04/05/2019   Ezra SitesLeslie Jazlynn Nemetz, OTR/Middleton  [phone] 07/04/2019, 12:14 PM  [facility] [address] [place], [place], [address] Phone: [phone]   Fax:  [phone]  Name: Henry OysterJustin Middleton Temkin MRN: [id number] Date of Birth: 09/04/1981

## 2019-07-07 ENCOUNTER — Other Ambulatory Visit: Payer: Self-pay

## 2019-07-07 ENCOUNTER — Encounter (HOSPITAL_COMMUNITY): Payer: Self-pay | Admitting: Occupational Therapy

## 2019-07-07 ENCOUNTER — Ambulatory Visit (HOSPITAL_COMMUNITY): Payer: BC Managed Care – PPO | Admitting: Occupational Therapy

## 2019-07-07 DIAGNOSIS — M25512 Pain in left shoulder: Secondary | ICD-10-CM

## 2019-07-07 DIAGNOSIS — M25612 Stiffness of left shoulder, not elsewhere classified: Secondary | ICD-10-CM | POA: Diagnosis not present

## 2019-07-07 DIAGNOSIS — R29898 Other symptoms and signs involving the musculoskeletal system: Secondary | ICD-10-CM

## 2019-07-07 NOTE — Therapy (Signed)
[place], [place], [address] Phone: [phone]   Fax:  [phone]  Occupational Therapy Treatment  Patient Details  Name: Heitor Steinhoff Wolken MRN: [id number] Date of Birth: 06-25-1981 Referring Provider (OT): Dr. Marchia Bond   Encounter Date: 07/07/2019  OT End of Session - 07/07/19 1640    Visit Number  12    Number of Visits  16    Date for OT Re-Evaluation  08/01/19    Authorization Type  [employer] PPO    Authorization Time Period  No visit limit    OT Start Time  1513    OT Stop Time  1552    OT Time Calculation (min)  39 min    Activity Tolerance  Patient tolerated treatment well    Behavior During Therapy  [facility] for tasks assessed/performed       Past Medical History:  Diagnosis Date  . Complete rotator cuff rupture of left shoulder 04/05/2019    Past Surgical History:  Procedure Laterality Date  . CORNEAL LACERATION REPAIR    . SHOULDER ARTHROSCOPY WITH BANKART REPAIR Left 04/05/2019   Procedure: SHOULDER ARTHROSCOPY WITH BANKART REPAIR;  Surgeon: Marchia Bond, MD;  Location: [place];  Service: Orthopedics;  Laterality: Left;  . SHOULDER ARTHROSCOPY WITH ROTATOR CUFF REPAIR AND SUBACROMIAL DECOMPRESSION Left 04/05/2019   Procedure: LEFT SHOULDER ARTHROSCOPY WITH DEBRIDEMENT, ROTATOR CUFF REPAIR, SUBACROMIAL DECOMPRESSION;  Surgeon: Marchia Bond, MD;  Location: [place];  Service: Orthopedics;  Laterality: Left;    There were no vitals filed for this visit.  Subjective Assessment - 07/07/19 1514    Subjective   S: It takes me a day or two to recover but I get a little more movement every time I'm here.    Currently in Pain?  No/denies         [facility] OT Assessment - 07/07/19 1514      Assessment   Medical Diagnosis  s/p left RTC repair with bankart       Precautions   Precautions  Shoulder    Type of Shoulder Precautions  Pt is 8 weeks out from sx.  Per MDs standard protocol pt can progress as tolerated at this time.                OT Treatments/Exercises (OP) - 07/07/19 1514      Exercises   Exercises  Shoulder      Shoulder Exercises: Supine   Protraction  PROM;5 reps;Strengthening;10 reps    Protraction Weight (lbs)  3    Horizontal ABduction  PROM;5 reps;Strengthening;10 reps    Horizontal ABduction Weight (lbs)  3    External Rotation  PROM;5 reps;Strengthening;10 reps   abducted   External Rotation Weight (lbs)  3    Internal Rotation  PROM;5 reps;Strengthening;10 reps   abducted   Internal Rotation Weight (lbs)  3    Flexion  PROM;5 reps;Strengthening;10 reps    Shoulder Flexion Weight (lbs)  3    ABduction  PROM;5 reps;Strengthening;10 reps    Shoulder ABduction Weight (lbs)  3      Shoulder Exercises: Sidelying   External Rotation  Theraband;12 reps    Theraband Level (Shoulder External Rotation)  Level 3 (Green)    Flexion  Theraband;12 reps    Theraband Level (Shoulder Flexion)  Level 3 (Green)    ABduction  Theraband;12 reps    Theraband Level (Shoulder ABduction)  Level 3 (Green)  Other Sidelying Exercises  protraction; green theraband, 12X      Shoulder Exercises: Standing   Protraction  Strengthening;12 reps;Theraband;10 reps    Theraband Level (Shoulder Protraction)  Level 3 (Green)    Protraction Weight (lbs)  3    Horizontal ABduction  Strengthening;Theraband;10 reps    Theraband Level (Shoulder Horizontal ABduction)  Level 3 (Green)    Horizontal ABduction Weight (lbs)  3    External Rotation  Strengthening;12 reps;Theraband;10 reps    Theraband Level (Shoulder External Rotation)  Level 3 (Green)    External Rotation Weight (lbs)  3    Internal Rotation  Strengthening;12 reps;Theraband;10 reps    Theraband Level (Shoulder Internal Rotation)  Level 3 (Green)    Internal Rotation Weight (lbs)  3    Flexion  Strengthening;12 reps    Shoulder Flexion Weight (lbs)  3    ABduction   Strengthening;10 reps    Shoulder ABduction Weight (lbs)  3    Other Standing Exercises  green loop band: lateral wall slides, diagonal wall slides at 11, 9, and 7; 10X each      Shoulder Exercises: Therapy Newman Pies   Other Therapy Ball Exercises  Green therapy ball: circles, flexion, chest press, diagonals 12X, 2# wrist weights      Shoulder Exercises: ROM/Strengthening   Cybex Press  --   bodycraft with 4# plate   Over Head Lace  2' standing at arms length    X to V Arms  12X with 1#    Proximal Shoulder Strengthening, Supine  12X with 3# no rest breaks    Ball on Wall  1' flexion 1' abduction red ball    Other ROM/Strengthening Exercises  ball pass behind back for IR, behind head for er, 12X each, using green weighted ball    Other ROM/Strengthening Exercises  arnold press, 1#, 10X; overhead carry using 3# weight, 1'                OT Short Term Goals - 06/03/19 1003      OT SHORT TERM GOAL #1   Title  Pt will be provided with and educated on HEP to improve functional use of LUE during ADL completion.    Time  4    Period  Weeks    Status  On-going    Target Date  07/02/19      OT SHORT TERM GOAL #2   Title  Pt will improve LUE P/ROM to WNL to improve ability to donn shirts with minimal compensatory strategies.    Time  4    Period  Weeks    Status  On-going      OT SHORT TERM GOAL #3   Title  Pt will increase LUE strength to 4-/5 to improve ability to reach for items in low overhead cabinets.    Status  On-going        OT Long Term Goals - 06/03/19 1003      OT LONG TERM GOAL #1   Title  Pt will achieve highest level of functioning using LUE as non-dominant during ADLs and work tasks.    Time  8    Period  Weeks    Status  On-going      OT LONG TERM GOAL #2   Title  Pt will decrease pain in LUE to 2/10 or less to improve ability to sleep comfortably at night.    Time  8    Period  Weeks    Status  On-going      OT LONG TERM GOAL #3   Title  Pt will  decrease fascial restriction in LUE to minimal amounts or less to improve mobility required for functional reaching tasks.    Time  8    Period  Weeks    Status  On-going      OT LONG TERM GOAL #4   Title  Pt will increase LUE A/ROM to [facility] to improve ability to perform overhead reaching tasks at home and work.    Time  8    Period  Weeks    Status  On-going      OT LONG TERM GOAL #5   Title  Pt will improve LUE strength to 5/5 to increase ability to perform work tasks.    Time  8    Period  Weeks    Status  On-going            Plan - 07/07/19 1640    Clinical Impression Statement  A: Continued working on shoulder strength and stability exercises this session. Continued with 3# weights and green theraband tasks, added wrist weights to therapy ball strengthening exercises. Added green loop band exercises today. Pt with mod fatigue throughout session, occasional rest breaks offered. Verbal cuing for form and technique.    Body Structure / Function / Physical Skills  ADL;Endurance;UE functional use;Fascial restriction;Pain;Flexibility;ROM;IADL;Strength    Plan  P: Continue with shoulder strengthening and stability, add prone hughston       Patient will benefit from skilled therapeutic intervention in order to improve the following deficits and impairments:   Body Structure / Function / Physical Skills: ADL, Endurance, UE functional use, Fascial restriction, Pain, Flexibility, ROM, IADL, Strength       Visit Diagnosis: Other symptoms and signs involving the musculoskeletal system  Acute pain of left shoulder  Stiffness of left shoulder, not elsewhere classified    Problem List Patient Active Problem List   Diagnosis Date Noted  . Complete rotator cuff rupture of left shoulder 04/05/2019   Ezra SitesLeslie Lakyn Mantione, OTR/L  [phone] 07/07/2019, 4:46 PM  [facility] [address] [place], [place], [address] Phone: [phone]   Fax:   [phone]  Name: Ranelle OysterJustin L Rudge MRN: [id number] Date of Birth: 02/14/1981

## 2019-07-11 ENCOUNTER — Other Ambulatory Visit: Payer: Self-pay

## 2019-07-11 ENCOUNTER — Ambulatory Visit (HOSPITAL_COMMUNITY): Payer: BC Managed Care – PPO

## 2019-07-11 ENCOUNTER — Encounter (HOSPITAL_COMMUNITY): Payer: Self-pay

## 2019-07-11 DIAGNOSIS — R29898 Other symptoms and signs involving the musculoskeletal system: Secondary | ICD-10-CM

## 2019-07-11 DIAGNOSIS — M25512 Pain in left shoulder: Secondary | ICD-10-CM

## 2019-07-11 DIAGNOSIS — M25612 Stiffness of left shoulder, not elsewhere classified: Secondary | ICD-10-CM | POA: Diagnosis not present

## 2019-07-11 NOTE — Therapy (Signed)
[facility] [address] [place], [place], [address] Phone: [phone]   Fax:  [phone]  Occupational Therapy Treatment  Patient Details  Name: Henry Middleton MRN: [id number] Date of Birth: 01/01/1981 Referring Provider (OT): Dr. Teryl LucyJoshua Landau   Encounter Date: 07/11/2019  OT End of Session - 07/11/19 1719    Visit Number  13    Number of Visits  16    Date for OT Re-Evaluation  08/01/19    Authorization Type  [employer] PPO    Authorization Time Period  No visit limit    OT Start Time  1650    OT Stop Time  1730    OT Time Calculation (min)  40 min    Activity Tolerance  Patient tolerated treatment well    Behavior During Therapy  [facility] for tasks assessed/performed       Past Medical History:  Diagnosis Date  . Complete rotator cuff rupture of left shoulder 04/05/2019    Past Surgical History:  Procedure Laterality Date  . CORNEAL LACERATION REPAIR    . SHOULDER ARTHROSCOPY WITH BANKART REPAIR Left 04/05/2019   Procedure: SHOULDER ARTHROSCOPY WITH BANKART REPAIR;  Surgeon: Teryl LucyLandau, Joshua, MD;  Location: [facility];  Service: Orthopedics;  Laterality: Left;  . SHOULDER ARTHROSCOPY WITH ROTATOR CUFF REPAIR AND SUBACROMIAL DECOMPRESSION Left 04/05/2019   Procedure: LEFT SHOULDER ARTHROSCOPY WITH DEBRIDEMENT, ROTATOR CUFF REPAIR, SUBACROMIAL DECOMPRESSION;  Surgeon: Teryl LucyLandau, Joshua, MD;  Location:  [facility];  Service: Orthopedics;  Laterality: Left;    There were no vitals filed for this visit.  Subjective Assessment - 07/11/19 1712    Subjective   S: I saw the doctor for 3 minutes and had to pay an $80 copay. I'm not going back for another visit.    Currently in Pain?  No/denies         [facility] - 07/11/19 1713      Assessment   Medical Diagnosis  s/p left RTC repair with bankart       Precautions   Precautions  Shoulder    Type of Shoulder Precautions  Progress as tolerated                OT Treatments/Exercises (OP) - 07/11/19 1714      Exercises   Exercises  Shoulder      Shoulder Exercises: Supine   Protraction  PROM;5 reps    Horizontal ABduction  PROM;5 reps    External Rotation  PROM;5 reps    Internal Rotation  PROM;5 reps    Flexion  PROM;5 reps    ABduction  PROM;5 reps      Shoulder Exercises: Prone   Other Prone Exercises  Hughston exercises: H1, H3, H4 10X      Shoulder Exercises: Sidelying   External Rotation  Theraband;12 reps    Theraband Level (Shoulder External Rotation)  Level 3 (Green)    Flexion  Theraband;12 reps    Theraband Level (Shoulder Flexion)  Level 3 (Green)    ABduction  Theraband;12 reps    Theraband Level (Shoulder ABduction)  Level 3 (Green)    Other Sidelying Exercises  protraction; green theraband, 12X      Shoulder Exercises: Standing   Other Standing Exercises  green loop band: lateral wall slides, diagonal wall slides at 11, 9, and 7; 10X each      Shoulder Exercises: Therapy Newman PiesBall   Other Therapy Ball Exercises  Green therapy ball: overhead  press, circles, flexion, chest press, diagonals 12X, 2# wrist weights      Shoulder Exercises: ROM/Strengthening   Cybex Press  --   Body craft 4#, 15X   X to V Arms  15X with 1#    Other ROM/Strengthening Exercises  Shoulder stability exercise with 3#. writing alphabet in the air while holding weight above shoulder level.    Other ROM/Strengthening Exercises  Overhead weighted carry; 3#, 1'               OT Short Term Goals - 06/03/19 1003      OT SHORT TERM GOAL #1   Title  Pt will be provided with and educated on HEP to improve functional use of LUE during ADL completion.    Time  4    Period  Weeks    Status  On-going    Target Date  07/02/19      OT SHORT TERM GOAL #2   Title  Pt will improve LUE P/ROM to WNL to improve ability to donn shirts with minimal compensatory strategies.    Time  4    Period  Weeks    Status  On-going       OT SHORT TERM GOAL #3   Title  Pt will increase LUE strength to 4-/5 to improve ability to reach for items in low overhead cabinets.    Status  On-going        OT Long Term Goals - 06/03/19 1003      OT LONG TERM GOAL #1   Title  Pt will achieve highest level of functioning using LUE as non-dominant during ADLs and work tasks.    Time  8    Period  Weeks    Status  On-going      OT LONG TERM GOAL #2   Title  Pt will decrease pain in LUE to 2/10 or less to improve ability to sleep comfortably at night.    Time  8    Period  Weeks    Status  On-going      OT LONG TERM GOAL #3   Title  Pt will decrease fascial restriction in LUE to minimal amounts or less to improve mobility required for functional reaching tasks.    Time  8    Period  Weeks    Status  On-going      OT LONG TERM GOAL #4   Title  Pt will increase LUE A/ROM to [facility] to improve ability to perform overhead reaching tasks at home and work.    Time  8    Period  Weeks    Status  On-going      OT LONG TERM GOAL #5   Title  Pt will improve LUE strength to 5/5 to increase ability to perform work tasks.    Time  8    Period  Weeks    Status  On-going            Plan - 07/11/19 1727    Clinical Impression Statement  A: Added hughston exercises with no added weight. Pt completed with max difficulty more due to baseline decreased flexibility. H4 was most challenging as pt was unable to complete the scapular squeeze prior to lift.    Body Structure / Function / Physical Skills  ADL;Endurance;UE functional use;Fascial restriction;Pain;Flexibility;ROM;IADL;Strength    Plan  P: Continue with shoulder strengthening and stability    Consulted and Agree with Plan of Care  Patient  Patient will benefit from skilled therapeutic intervention in order to improve the following deficits and impairments:   Body Structure / Function / Physical Skills: ADL, Endurance, UE functional use, Fascial restriction, Pain,  Flexibility, ROM, IADL, Strength       Visit Diagnosis: Stiffness of left shoulder, not elsewhere classified  Acute pain of left shoulder  Other symptoms and signs involving the musculoskeletal system    Problem List Patient Active Problem List   Diagnosis Date Noted  . Complete rotator cuff rupture of left shoulder 04/05/2019   Ailene Ravel, OTR/L,CBIS  [phone]  07/11/2019, 5:31 PM  [place], [place], [address] Phone: [phone]   Fax:  [phone]  Name: Henry Middleton MRN: [id number] Date of Birth: 1981/06/13

## 2019-07-13 ENCOUNTER — Other Ambulatory Visit: Payer: Self-pay

## 2019-07-13 ENCOUNTER — Encounter (HOSPITAL_COMMUNITY): Payer: Self-pay

## 2019-07-13 ENCOUNTER — Ambulatory Visit (HOSPITAL_COMMUNITY): Payer: BC Managed Care – PPO | Attending: Orthopedic Surgery

## 2019-07-13 DIAGNOSIS — R29898 Other symptoms and signs involving the musculoskeletal system: Secondary | ICD-10-CM | POA: Diagnosis not present

## 2019-07-13 DIAGNOSIS — M25612 Stiffness of left shoulder, not elsewhere classified: Secondary | ICD-10-CM | POA: Insufficient documentation

## 2019-07-13 DIAGNOSIS — M25512 Pain in left shoulder: Secondary | ICD-10-CM | POA: Diagnosis present

## 2019-07-13 NOTE — Therapy (Signed)
[place], [place], [address] Phone: [phone]   Fax:  [phone]  Occupational Therapy Treatment  Patient Details  Name: Henry Middleton MRN: [id number] Date of Birth: Jan 31, 1981 Referring Provider (OT): Dr. Marchia Bond   Encounter Date: 07/13/2019  OT End of Session - 07/13/19 1600    Visit Number  14    Number of Visits  16    Date for OT Re-Evaluation  08/01/19    Authorization Type  [employer] P[employer]    Authorization Time Period  No visit limit    OT Start Time  1520    OT Stop Time  1600    OT Time Calculation (min)  40 min    Activity Tolerance  Patient tolerated treatment well    Behavior During Therapy  [facility] for tasks assessed/performed       Past Medical History:  Diagnosis Date  . Complete rotator cuff rupture of left shoulder 04/05/2019    Past Surgical History:  Procedure Laterality Date  . CORNEAL LACERATION REPAIR    . SHOULDER ARTHROSCOPY WITH BANKART REPAIR Left 04/05/2019   Procedure: SHOULDER ARTHROSCOPY WITH BANKART REPAIR;  Surgeon: Marchia Bond, MD;  Location: [facility];  Service: Orthopedics;  Laterality: Left;  . SHOULDER ARTHROSCOPY WITH ROTATOR CUFF REPAIR AND SUBACROMIAL DECOMPRESSION Left 04/05/2019   Procedure: LEFT SHOULDER ARTHROSCOPY WITH DEBRIDEMENT, ROTATOR CUFF REPAIR, SUBACROMIAL DECOMPRESSION;  Surgeon: Marchia Bond, MD;  Location: [facility];  Service: Orthopedics;  Laterality: Left;    There were no vitals filed for this visit.  Subjective Assessment - 07/13/19 1530    Subjective   S: I pulled a lift off a bus today.    Currently in Pain?  No/denies         [facility] OT Assessment - 07/13/19 1530      Assessment   Medical Diagnosis  s/p left RTC repair with bankart       Precautions   Precautions  Shoulder    Type of Shoulder Precautions  Progress as tolerated               OT Treatments/Exercises (OP) - 07/13/19  1530      Exercises   Exercises  Shoulder      Shoulder Exercises: Supine   Protraction  PROM;5 reps    Horizontal ABduction  PROM;5 reps    External Rotation  PROM;5 reps    Internal Rotation  PROM;5 reps    Flexion  PROM;5 reps    ABduction  PROM;5 reps      Shoulder Exercises: Standing   Horizontal ABduction  Theraband;12 reps    Theraband Level (Shoulder Horizontal ABduction)  Level 3 (Green)    External Rotation  Theraband;12 reps    Theraband Level (Shoulder External Rotation)  Level 3 (Green)    Internal Rotation  Theraband;12 reps    Theraband Level (Shoulder Internal Rotation)  Level 3 (Green)    Flexion  Theraband;12 reps    Theraband Level (Shoulder Flexion)  Level 3 (Green)    ABduction  Theraband;12 reps    Theraband Level (Shoulder ABduction)  Level 3 (Green)    Other Standing Exercises  green loop band: lateral wall slides, diagonal wall slides at 11, 9, and 7; 12X each    Other Standing Exercises  Progressive proximal shoulder strenghthening with green band; 5 times total      Shoulder Exercises: ROM/Strengthening   Cybex Press  --  bodycraft 50# 15X   Over Head Lace  2' seated at arms length with 2# wrist weights               OT Short Term Goals - 06/03/19 1003      OT SHORT TERM GOAL #1   Title  Pt will be provided with and educated on HEP to improve functional use of LUE during ADL completion.    Time  4    Period  Weeks    Status  On-going    Target Date  07/02/19      OT SHORT TERM GOAL #2   Title  Pt will improve LUE P/ROM to WNL to improve ability to donn shirts with minimal compensatory strategies.    Time  4    Period  Weeks    Status  On-going      OT SHORT TERM GOAL #3   Title  Pt will increase LUE strength to 4-/5 to improve ability to reach for items in low overhead cabinets.    Status  On-going        OT Long Term Goals - 06/03/19 1003      OT LONG TERM GOAL #1   Title  Pt will achieve highest level of functioning  using LUE as non-dominant during ADLs and work tasks.    Time  8    Period  Weeks    Status  On-going      OT LONG TERM GOAL #2   Title  Pt will decrease pain in LUE to 2/10 or less to improve ability to sleep comfortably at night.    Time  8    Period  Weeks    Status  On-going      OT LONG TERM GOAL #3   Title  Pt will decrease fascial restriction in LUE to minimal amounts or less to improve mobility required for functional reaching tasks.    Time  8    Period  Weeks    Status  On-going      OT LONG TERM GOAL #4   Title  Pt will increase LUE A/ROM to [facility] to improve ability to perform overhead reaching tasks at home and work.    Time  8    Period  Weeks    Status  On-going      OT LONG TERM GOAL #5   Title  Pt will improve LUE strength to 5/5 to increase ability to perform work tasks.    Time  8    Period  Weeks    Status  On-going            Plan - 07/13/19 1600    Clinical Impression Statement  A: Focused on overhead activities and increasing shoulder/scapulat stability and endurance. Pt fatigued with exercises and demonstrated max difficulty although was able to complete all exercises requested. VC for form and technique were provided as needed.    Body Structure / Function / Physical Skills  ADL;Endurance;UE functional use;Fascial restriction;Pain;Flexibility;ROM;IADL;Strength    Plan  P: Continue with shoulder strengthening and stability. Completed wall push ups and countertop push ups if able to complete with proper form.    Consulted and Agree with Plan of Care  Patient       Patient will benefit from skilled therapeutic intervention in order to improve the following deficits and impairments:   Body Structure / Function / Physical Skills: ADL, Endurance, UE functional use, Fascial restriction, Pain, Flexibility, ROM, IADL, Strength  Visit Diagnosis: Other symptoms and signs involving the musculoskeletal system  Acute pain of left shoulder  Stiffness  of left shoulder, not elsewhere classified    Problem List Patient Active Problem List   Diagnosis Date Noted  . Complete rotator cuff rupture of left shoulder 04/05/2019   Limmie PatriciaLaura , OTR/L,CBIS  [phone]  07/13/2019, 4:02 PM  [place] [facility] [address] [place], [place], [address] Phone: [phone]   Fax:  [phone]  Name: Henry OysterJustin L Middleton MRN: [id number] Date of Birth: 03/02/1981

## 2019-07-20 ENCOUNTER — Other Ambulatory Visit: Payer: Self-pay

## 2019-07-20 ENCOUNTER — Ambulatory Visit (HOSPITAL_COMMUNITY): Payer: BC Managed Care – PPO

## 2019-07-20 ENCOUNTER — Encounter (HOSPITAL_COMMUNITY): Payer: Self-pay

## 2019-07-20 DIAGNOSIS — R29898 Other symptoms and signs involving the musculoskeletal system: Secondary | ICD-10-CM

## 2019-07-20 DIAGNOSIS — M25512 Pain in left shoulder: Secondary | ICD-10-CM

## 2019-07-20 DIAGNOSIS — M25612 Stiffness of left shoulder, not elsewhere classified: Secondary | ICD-10-CM

## 2019-07-20 NOTE — Therapy (Signed)
[facility] [address] [place], [place], [address] Phone: [phone]   Fax:  [phone]  Occupational Therapy Treatment  Patient Details  Name: Henry Middleton MRN: [id number] Date of Birth: 1981-09-14 Referring Provider (OT): Dr. Teryl Lucy   Encounter Date: 07/20/2019  OT End of Session - 07/20/19 1752    Visit Number  15    Number of Visits  16    Date for OT Re-Evaluation  08/01/19    Authorization Type  [employer] P[employer]    Authorization Time Period  No visit limit    OT Start Time  1516    OT Stop Time  1554    OT Time Calculation (min)  38 min    Activity Tolerance  Patient tolerated treatment well    Behavior During Therapy  [facility] for tasks assessed/performed       Past Medical History:  Diagnosis Date  . Complete rotator cuff rupture of left shoulder 04/05/2019    Past Surgical History:  Procedure Laterality Date  . CORNEAL LACERATION REPAIR    . SHOULDER ARTHROSCOPY WITH BANKART REPAIR Left 04/05/2019   Procedure: SHOULDER ARTHROSCOPY WITH BANKART REPAIR;  Surgeon: Teryl Lucy, MD;  Location: [facility];  Service: Orthopedics;  Laterality: Left;  . SHOULDER ARTHROSCOPY WITH ROTATOR CUFF REPAIR AND SUBACROMIAL DECOMPRESSION Left 04/05/2019   Procedure: LEFT SHOULDER ARTHROSCOPY WITH DEBRIDEMENT, ROTATOR CUFF REPAIR, SUBACROMIAL DECOMPRESSION;  Surgeon: Teryl Lucy, MD;  Location: [facility];  Service: Orthopedics;  Laterality: Left;    There were no vitals filed for this visit.  Subjective Assessment - 07/20/19 1528    Subjective   S: I was sore after building a deck.    Currently in Pain?  No/denies         [facility] OT Assessment - 07/20/19 1755      Assessment   Medical Diagnosis  s/p left RTC repair with bankart       Precautions   Precautions  Shoulder    Type of Shoulder Precautions  Progress as tolerated               OT Treatments/Exercises (OP) -  07/20/19 1531      Exercises   Exercises  Shoulder      Shoulder Exercises: Supine   Protraction  PROM;5 reps    Horizontal ABduction  PROM;5 reps    External Rotation  PROM;5 reps    Internal Rotation  PROM;5 reps    Flexion  PROM;5 reps    ABduction  PROM;5 reps      Shoulder Exercises: Standing   Flexion  Theraband;12 reps    Theraband Level (Shoulder Flexion)  Level 4 (Blue)    ABduction  Theraband;12 reps    Theraband Level (Shoulder ABduction)  Level 4 (Blue)    Other Standing Exercises  Y arm lift off; 2# wrist weights; 12X      Shoulder Exercises: ROM/Strengthening   Cybex Press  --   50# at 15X then 60# at 15X   Over Head Lace  2' seated at arms length with 2# wrist weights    Wall Pushups  10 reps    Pushups  Other (comment)   12X counter top 12X knee push ups   Other ROM/Strengthening Exercises  Shoulder stability exercise with 3#, 1'25". Holding 4# handweight, pt drew alphabet in air holding weight at shoulder level.    Other ROM/Strengthening Exercises  Standing strict overhead press 10X with  5# dowe, 10X with 7#, 10X  with 8#             OT Education - 07/20/19 1752    Education Details  Pt provided with blue theraband for scapular exercises.    Person(s) Educated  Patient    Methods  Explanation    Comprehension  Verbalized understanding       OT Short Term Goals - 06/03/19 1003      OT SHORT TERM GOAL #1   Title  Pt will be provided with and educated on HEP to improve functional use of LUE during ADL completion.    Time  4    Period  Weeks    Status  On-going    Target Date  07/02/19      OT SHORT TERM GOAL #2   Title  Pt will improve LUE P/ROM to WNL to improve ability to donn shirts with minimal compensatory strategies.    Time  4    Period  Weeks    Status  On-going      OT SHORT TERM GOAL #3   Title  Pt will increase LUE strength to 4-/5 to improve ability to reach for items in low overhead cabinets.    Status  On-going        OT  Long Term Goals - 06/03/19 1003      OT LONG TERM GOAL #1   Title  Pt will achieve highest level of functioning using LUE as non-dominant during ADLs and work tasks.    Time  8    Period  Weeks    Status  On-going      OT LONG TERM GOAL #2   Title  Pt will decrease pain in LUE to 2/10 or less to improve ability to sleep comfortably at night.    Time  8    Period  Weeks    Status  On-going      OT LONG TERM GOAL #3   Title  Pt will decrease fascial restriction in LUE to minimal amounts or less to improve mobility required for functional reaching tasks.    Time  8    Period  Weeks    Status  On-going      OT LONG TERM GOAL #4   Title  Pt will increase LUE A/ROM to [facility] to improve ability to perform overhead reaching tasks at home and work.    Time  8    Period  Weeks    Status  On-going      OT LONG TERM GOAL #5   Title  Pt will improve LUE strength to 5/5 to increase ability to perform work tasks.    Time  8    Period  Weeks    Status  On-going            Plan - 07/20/19 1752    Clinical Impression Statement  A: Session focused on overhead strength and endurance. Patient was able to complete overhead carry while almost reaching 1'30". He did complete wall push ups, counter top push ups. and push ups on kness with great form and no pain. Increased blue theraband for HEP scapular exercises. VC for form and technique.    Body Structure / Function / Physical Skills  ADL;Endurance;UE functional use;Fascial restriction;Pain;Flexibility;ROM;IADL;Strength    Plan  P: Update HEP for blue band : Flexion, horizontal abduction, and abduction. Complete arms on fire.    Consulted and Agree with Plan of Care  Patient       Patient will benefit from skilled therapeutic intervention in order to improve the following deficits and impairments:   Body Structure / Function / Physical Skills: ADL, Endurance, UE functional use, Fascial restriction, Pain, Flexibility, ROM, IADL, Strength        Visit Diagnosis: Other symptoms and signs involving the musculoskeletal system  Acute pain of left shoulder  Stiffness of left shoulder, not elsewhere classified    Problem List Patient Active Problem List   Diagnosis Date Noted  . Complete rotator cuff rupture of left shoulder 04/05/2019   Ailene Ravel, OTR/L,CBIS  [phone]  07/20/2019, 5:55 PM  [place], [place], [address] Phone: [phone]   Fax:  [phone]  Name: Henry Middleton MRN: [id number] Date of Birth: [birth year]

## 2019-07-22 ENCOUNTER — Ambulatory Visit (HOSPITAL_COMMUNITY): Payer: BC Managed Care – PPO | Admitting: Occupational Therapy

## 2019-07-22 ENCOUNTER — Encounter (HOSPITAL_COMMUNITY): Payer: Self-pay | Admitting: Occupational Therapy

## 2019-07-22 ENCOUNTER — Other Ambulatory Visit: Payer: Self-pay

## 2019-07-22 DIAGNOSIS — R29898 Other symptoms and signs involving the musculoskeletal system: Secondary | ICD-10-CM | POA: Diagnosis not present

## 2019-07-22 DIAGNOSIS — M25512 Pain in left shoulder: Secondary | ICD-10-CM

## 2019-07-22 DIAGNOSIS — M25612 Stiffness of left shoulder, not elsewhere classified: Secondary | ICD-10-CM

## 2019-07-22 NOTE — Therapy (Signed)
[place], [place], [address] Phone: [phone]   Fax:  [phone]  Occupational Therapy Treatment  Patient Details  Name: Henry Middleton MRN: [id number] Date of Birth: [birth year] Referring Provider (OT): Dr. Marchia Bond   Encounter Date: 07/22/2019  OT End of Session - 07/22/19 1619    Visit Number  16    Number of Visits  18    Date for OT Re-Evaluation  08/01/19    Authorization Type  [place] PPO    Authorization Time Period  No visit limit    OT Start Time  1432    OT Stop Time  1511    OT Time Calculation (min)  39 min    Activity Tolerance  Patient tolerated treatment well    Behavior During Therapy  [facility] for tasks assessed/performed       Past Medical History:  Diagnosis Date  . Complete rotator cuff rupture of left shoulder 04/05/2019    Past Surgical History:  Procedure Laterality Date  . CORNEAL LACERATION REPAIR    . SHOULDER ARTHROSCOPY WITH BANKART REPAIR Left 04/05/2019   Procedure: SHOULDER ARTHROSCOPY WITH BANKART REPAIR;  Surgeon: Marchia Bond, MD;  Location: [place];  Service: Orthopedics;  Laterality: Left;  . SHOULDER ARTHROSCOPY WITH ROTATOR CUFF REPAIR AND SUBACROMIAL DECOMPRESSION Left 04/05/2019   Procedure: LEFT SHOULDER ARTHROSCOPY WITH DEBRIDEMENT, ROTATOR CUFF REPAIR, SUBACROMIAL DECOMPRESSION;  Surgeon: Marchia Bond, MD;  Location: [place];  Service: Orthopedics;  Laterality: Left;    There were no vitals filed for this visit.  Subjective Assessment - 07/22/19 1618    Subjective   S: I slept with my arm out to the side and the baby in the crook of my shoulder so I think that's why it's bothering me a little.    Currently in Pain?  Yes    Pain Score  2     Pain Location  Shoulder    Pain Orientation  Left    Pain Descriptors / Indicators  Sore    Pain Type  Acute pain    Pain Radiating Towards  n/a    Pain Onset  In the past  7 days    Pain Frequency  Intermittent    Aggravating Factors   sleeping in wrong position    Pain Relieving Factors  rest    Effect of Pain on Daily Activities  min effect on ADLs    Multiple Pain Sites  No                   OT Treatments/Exercises (OP) - 07/22/19 1435      Exercises   Exercises  Shoulder      Shoulder Exercises: Supine   Protraction  PROM;5 reps    Horizontal ABduction  PROM;5 reps    External Rotation  PROM;5 reps    Internal Rotation  PROM;5 reps    Flexion  PROM;5 reps    ABduction  PROM;5 reps      Shoulder Exercises: Prone   Other Prone Exercises  [place] exercises: H1, H3, H4 10X      Shoulder Exercises: Standing   Horizontal ABduction  Theraband;12 reps    Theraband Level (Shoulder Horizontal ABduction)  Level 4 (Blue)    External Rotation  Theraband;12 reps   shoulder flexed and elbow flexed to 90   Theraband Level (Shoulder External Rotation)  Level 4 (Blue)    Internal Rotation  Theraband;12 reps   shoulder abducted and elbow flexed to 90   Theraband Level (Shoulder Internal Rotation)  Level 4 (Blue)    Flexion  Theraband;12 reps    Theraband Level (Shoulder Flexion)  Level 4 (Blue)    ABduction  Theraband;12 reps    Theraband Level (Shoulder ABduction)  Level 4 (Blue)    Other Standing Exercises  arms on fire, 3' total, 5 positions      Shoulder Exercises: ROM/Strengthening   Cybex Press  --   50# at 15X then 60# at 15X   Pushups  Other (comment)   12x countertop, 12x knee pushups   Other ROM/Strengthening Exercises  overhead carry, 3#, 1'25"; alphabet writing-4#; arnold press-3#, 10X    Other ROM/Strengthening Exercises  Bodycraft: horizontal abduction, 20# 15X; chest fly 20# 10X then 30# 10X; chest press and overhead press 20# 10X               OT Short Term Goals - 06/03/19 1003      OT SHORT TERM GOAL #1   Title  Pt will be provided with and educated on HEP to improve functional use of LUE during ADL  completion.    Time  4    Period  Weeks    Status  On-going    Target Date  07/02/19      OT SHORT TERM GOAL #2   Title  Pt will improve LUE P/ROM to WNL to improve ability to donn shirts with minimal compensatory strategies.    Time  4    Period  Weeks    Status  On-going      OT SHORT TERM GOAL #3   Title  Pt will increase LUE strength to 4-/5 to improve ability to reach for items in low overhead cabinets.    Status  On-going        OT Long Term Goals - 06/03/19 1003      OT LONG TERM GOAL #1   Title  Pt will achieve highest level of functioning using LUE as non-dominant during ADLs and work tasks.    Time  8    Period  Weeks    Status  On-going      OT LONG TERM GOAL #2   Title  Pt will decrease pain in LUE to 2/10 or less to improve ability to sleep comfortably at night.    Time  8    Period  Weeks    Status  On-going      OT LONG TERM GOAL #3   Title  Pt will decrease fascial restriction in LUE to minimal amounts or less to improve mobility required for functional reaching tasks.    Time  8    Period  Weeks    Status  On-going      OT LONG TERM GOAL #4   Title  Pt will increase LUE A/ROM to [facility] to improve ability to perform overhead reaching tasks at home and work.    Time  8    Period  Weeks    Status  On-going      OT LONG TERM GOAL #5   Title  Pt will improve LUE strength to 5/5 to increase ability to perform work tasks.    Time  8    Period  Weeks    Status  On-going            Plan - 07/22/19 1619    Clinical Impression Statement  A: Session  focusing on shoulder strength and endurance including overhead strength. Pt continued with overhead carry tasks and strengthening, added arms on fire and Bodycraft exercises. Pt with mod fatigue at end of session, no increased pain. Verbal cuing for form and technique.    Body Structure / Function / Physical Skills  ADL;Endurance;UE functional use;Fascial restriction;Pain;Flexibility;ROM;IADL;Strength    Plan   P: Follow up on blue band HEP. Continue with arms on fire and bodycraft work       Patient will benefit from skilled therapeutic intervention in order to improve the following deficits and impairments:   Body Structure / Function / Physical Skills: ADL, Endurance, UE functional use, Fascial restriction, Pain, Flexibility, ROM, IADL, Strength       Visit Diagnosis: Other symptoms and signs involving the musculoskeletal system  Acute pain of left shoulder  Stiffness of left shoulder, not elsewhere classified    Problem List Patient Active Problem List   Diagnosis Date Noted  . Complete rotator cuff rupture of left shoulder 04/05/2019   Ezra SitesLeslie Troxler, OTR/L  [phone] 07/22/2019, 4:22 PM  [facility] [address] [place], [place], [address] Phone: [phone]   Fax:  [phone]  Name: Henry OysterJustin L Lemberger MRN: [id number] Date of Birth: 09/12/1981

## 2019-07-26 ENCOUNTER — Encounter (HOSPITAL_COMMUNITY): Payer: Self-pay | Admitting: Occupational Therapy

## 2019-07-26 ENCOUNTER — Ambulatory Visit (HOSPITAL_COMMUNITY): Payer: BC Managed Care – PPO | Admitting: Occupational Therapy

## 2019-07-26 ENCOUNTER — Other Ambulatory Visit: Payer: Self-pay

## 2019-07-26 DIAGNOSIS — M25612 Stiffness of left shoulder, not elsewhere classified: Secondary | ICD-10-CM

## 2019-07-26 DIAGNOSIS — R29898 Other symptoms and signs involving the musculoskeletal system: Secondary | ICD-10-CM | POA: Diagnosis not present

## 2019-07-26 DIAGNOSIS — M25512 Pain in left shoulder: Secondary | ICD-10-CM

## 2019-07-26 NOTE — Therapy (Signed)
[facility] [address] [place], [place], [address] Phone: [phone]   Fax:  [phone]  Occupational Therapy Treatment  Patient Details  Name: Henry Middleton MRN: [id number] Date of Birth: 05/04/1981 Referring Provider (OT): Dr. Teryl LucyJoshua Landau   Encounter Date: 07/26/2019  OT End of Session - 07/26/19 1712    Visit Number  17    Number of Visits  18    Date for OT Re-Evaluation  08/01/19    Authorization Type  [employer] PPO    Authorization Time Period  No visit limit    OT Start Time  1517    OT Stop Time  1556    OT Time Calculation (min)  39 min    Activity Tolerance  Patient tolerated treatment well    Behavior During Therapy  [facility] for tasks assessed/performed       Past Medical History:  Diagnosis Date  . Complete rotator cuff rupture of left shoulder 04/05/2019    Past Surgical History:  Procedure Laterality Date  . CORNEAL LACERATION REPAIR    . SHOULDER ARTHROSCOPY WITH BANKART REPAIR Left 04/05/2019   Procedure: SHOULDER ARTHROSCOPY WITH BANKART REPAIR;  Surgeon: Teryl LucyLandau, Joshua, MD;  Location: [facility];  Service: Orthopedics;  Laterality: Left;  . SHOULDER ARTHROSCOPY WITH ROTATOR CUFF REPAIR AND SUBACROMIAL DECOMPRESSION Left 04/05/2019   Procedure: LEFT SHOULDER ARTHROSCOPY WITH DEBRIDEMENT, ROTATOR CUFF REPAIR, SUBACROMIAL DECOMPRESSION;  Surgeon: Teryl LucyLandau, Joshua, MD;  Location: [facility];  Service: Orthopedics;  Laterality: Left;    There were no vitals filed for this visit.  Subjective Assessment - 07/26/19 1517    Subjective   S: I was pouring a drink at lunch and had a weird pain.    Currently in Pain?  Yes    Pain Score  2     Pain Location  Shoulder    Pain Orientation  Left    Pain Descriptors / Indicators  Sore    Pain Type  Acute pain    Pain Radiating Towards  n/a    Pain Onset  In the past 7 days    Pain Frequency  Intermittent    Aggravating Factors   certain  movements    Pain Relieving Factors  rest    Effect of Pain on Daily Activities  min effect ADLs    Multiple Pain Sites  No         [facility] OT Assessment - 07/26/19 1515      Assessment   Medical Diagnosis  s/p left RTC repair with bankart       Precautions   Precautions  Shoulder    Type of Shoulder Precautions  Progress as tolerated               OT Treatments/Exercises (OP) - 07/26/19 1520      Exercises   Exercises  Shoulder      Shoulder Exercises: Supine   Protraction  PROM;5 reps    Horizontal ABduction  PROM;5 reps    External Rotation  PROM;5 reps    Internal Rotation  PROM;5 reps    Flexion  PROM;5 reps    ABduction  PROM;5 reps      Shoulder Exercises: Prone   Other Prone Exercises  [place]ughston exercises: H1, H3, H4 10X      Shoulder Exercises: Standing   Diagonals  Theraband;12 reps    Theraband Level (Shoulder Diagonals)  Level 4 (Blue)    Other Standing  Exercises  arms on fire, 3' total, 5 positions at 15" each      Shoulder Exercises: ROM/Strengthening   Other ROM/Strengthening Exercises  overhead carry, 3#, 1'25"; alphabet writing-4#; arnold press-3#, 10X    Other ROM/Strengthening Exercises  Bodycraft: horizontal abduction, 20# 15X; chest fly 20# 10X; chest press 20# 10X, er with shoulder and elbow flexed to 90-OT blocking elbow, 10X      Shoulder Exercises: Body Blade   Flexion  5 reps   15 second hold at end range   ABduction  5 reps   15 second hold at end range   External Rotation  5 reps   15 second hold at end range   Internal Rotation  5 reps   15 second hold at end range              OT Short Term Goals - 06/03/19 1003      OT SHORT TERM GOAL #1   Title  Pt will be provided with and educated on HEP to improve functional use of LUE during ADL completion.    Time  4    Period  Weeks    Status  On-going    Target Date  07/02/19      OT SHORT TERM GOAL #2   Title  Pt will improve LUE P/ROM to WNL to improve ability to  donn shirts with minimal compensatory strategies.    Time  4    Period  Weeks    Status  On-going      OT SHORT TERM GOAL #3   Title  Pt will increase LUE strength to 4-/5 to improve ability to reach for items in low overhead cabinets.    Status  On-going        OT Long Term Goals - 06/03/19 1003      OT LONG TERM GOAL #1   Title  Pt will achieve highest level of functioning using LUE as non-dominant during ADLs and work tasks.    Time  8    Period  Weeks    Status  On-going      OT LONG TERM GOAL #2   Title  Pt will decrease pain in LUE to 2/10 or less to improve ability to sleep comfortably at night.    Time  8    Period  Weeks    Status  On-going      OT LONG TERM GOAL #3   Title  Pt will decrease fascial restriction in LUE to minimal amounts or less to improve mobility required for functional reaching tasks.    Time  8    Period  Weeks    Status  On-going      OT LONG TERM GOAL #4   Title  Pt will increase LUE A/ROM to [facility] to improve ability to perform overhead reaching tasks at home and work.    Time  8    Period  Weeks    Status  On-going      OT LONG TERM GOAL #5   Title  Pt will improve LUE strength to 5/5 to increase ability to perform work tasks.    Time  8    Period  Weeks    Status  On-going            Plan - 07/26/19 1712    Clinical Impression Statement  A: Session focusing on shoulder strength and endurance. Pt improving in tolerance for overhead carry and arms on fire.  Added body blade exercises with 15" hold at end range, and continued with Bodycraft exercise. Pt most limited with er. Verbal cuing for form and technique.    Body Structure / Function / Physical Skills  ADL;Endurance;UE functional use;Fascial restriction;Pain;Flexibility;ROM;IADL;Strength    Plan  P: er strengthening with band or Bodycraft       Patient will benefit from skilled therapeutic intervention in order to improve the following deficits and impairments:   Body  Structure / Function / Physical Skills: ADL, Endurance, UE functional use, Fascial restriction, Pain, Flexibility, ROM, IADL, Strength       Visit Diagnosis: Other symptoms and signs involving the musculoskeletal system  Acute pain of left shoulder  Stiffness of left shoulder, not elsewhere classified    Problem List Patient Active Problem List   Diagnosis Date Noted  . Complete rotator cuff rupture of left shoulder 04/05/2019   Henry Middleton, OTR/L  [phone] 07/26/2019, 5:16 PM  [facility] [address] [place], [place], [address] Phone: [phone]   Fax:  [phone]  Name: Shivaan Tierno Beitz MRN: [id number] Date of Birth: [birth year]

## 2019-07-27 ENCOUNTER — Encounter (HOSPITAL_COMMUNITY): Payer: Self-pay

## 2019-07-27 ENCOUNTER — Ambulatory Visit (HOSPITAL_COMMUNITY): Payer: BC Managed Care – PPO

## 2019-07-27 DIAGNOSIS — M25512 Pain in left shoulder: Secondary | ICD-10-CM

## 2019-07-27 DIAGNOSIS — M25612 Stiffness of left shoulder, not elsewhere classified: Secondary | ICD-10-CM

## 2019-07-27 DIAGNOSIS — R29898 Other symptoms and signs involving the musculoskeletal system: Secondary | ICD-10-CM

## 2019-07-27 NOTE — Therapy (Signed)
[facility] [address] [place], [place], [address] Phone: [phone]   Fax:  [phone]  Occupational Therapy Treatment  Patient Details  Name: Henry Middleton MRN: [id number] Date of Birth: Mayo 03, 1982 Referring Provider (OT): Dr. Teryl Lucy   Encounter Date: 07/27/2019  OT End of Session - 07/27/19 1612    Visit Number  18    Number of Visits  18    Date for OT Re-Evaluation  08/01/19    Authorization Type  [employer] PPO    Authorization Time Period  No visit limit    OT Start Time  1515    OT Stop Time  1553    OT Time Calculation (min)  38 min    Activity Tolerance  Patient tolerated treatment well    Behavior During Therapy  [facility] for tasks assessed/performed       Past Medical History:  Diagnosis Date  . Complete rotator cuff rupture of left shoulder 04/05/2019    Past Surgical History:  Procedure Laterality Date  . CORNEAL LACERATION REPAIR    . SHOULDER ARTHROSCOPY WITH BANKART REPAIR Left 04/05/2019   Procedure: SHOULDER ARTHROSCOPY WITH BANKART REPAIR;  Surgeon: Teryl Lucy, MD;  Location: [facility];  Service: Orthopedics;  Laterality: Left;  . SHOULDER ARTHROSCOPY WITH ROTATOR CUFF REPAIR AND SUBACROMIAL DECOMPRESSION Left 04/05/2019   Procedure: LEFT SHOULDER ARTHROSCOPY WITH DEBRIDEMENT, ROTATOR CUFF REPAIR, SUBACROMIAL DECOMPRESSION;  Surgeon: Teryl Lucy, MD;  Location: [facility];  Service: Orthopedics;  Laterality: Left;    There were no vitals filed for this visit.  Subjective Assessment - 07/27/19 1527    Subjective   S: It's just feeling sore today. It's been more sore since I felt it pop the other day.    Currently in Pain?  Yes    Pain Score  6     Pain Location  Shoulder    Pain Orientation  Left    Pain Descriptors / Indicators  Sore    Pain Type  Acute pain         [facility] OT Assessment - 07/27/19 1527      Assessment   Medical Diagnosis  s/p left RTC  repair with bankart       Precautions   Precautions  Shoulder    Type of Shoulder Precautions  Progress as tolerated               OT Treatments/Exercises (OP) - 07/27/19 1528      Exercises   Exercises  Shoulder      Shoulder Exercises: Supine   Protraction  PROM;5 reps    Horizontal ABduction  PROM;5 reps    External Rotation  PROM;5 reps    Internal Rotation  PROM;5 reps    Flexion  PROM;5 reps    ABduction  PROM;5 reps      Shoulder Exercises: Seated   Other Seated Exercises  Bodycraft: 60#; 15X    Other Seated Exercises  Bodycraft: lat pull down bar; 80#; 15X      Shoulder Exercises: Standing   Diagonals  Theraband;12 reps    Theraband Level (Shoulder Diagonals)  Level 4 (Blue)    Other Standing Exercises  green band loop; lateral slide, wall walks, and lateral slide; 15X each      Shoulder Exercises: ROM/Strengthening   Cybex Press  --   60# plate 69G   Other ROM/Strengthening Exercises  overhead carry, 3#, 1'25"; alphabet writing-4#; arnold press-4#, 10X  Shoulder Exercises: Body Blade   ABduction  5 reps   15 second hold at the top              OT Short Term Goals - 06/03/19 1003      OT SHORT TERM GOAL #1   Title  Pt will be provided with and educated on HEP to improve functional use of LUE during ADL completion.    Time  4    Period  Weeks    Status  On-going    Target Date  07/02/19      OT SHORT TERM GOAL #2   Title  Pt will improve LUE P/ROM to WNL to improve ability to donn shirts with minimal compensatory strategies.    Time  4    Period  Weeks    Status  On-going      OT SHORT TERM GOAL #3   Title  Pt will increase LUE strength to 4-/5 to improve ability to reach for items in low overhead cabinets.    Status  On-going        OT Long Term Goals - 06/03/19 1003      OT LONG TERM GOAL #1   Title  Pt will achieve highest level of functioning using LUE as non-dominant during ADLs and work tasks.    Time  8    Period   Weeks    Status  On-going      OT LONG TERM GOAL #2   Title  Pt will decrease pain in LUE to 2/10 or less to improve ability to sleep comfortably at night.    Time  8    Period  Weeks    Status  On-going      OT LONG TERM GOAL #3   Title  Pt will decrease fascial restriction in LUE to minimal amounts or less to improve mobility required for functional reaching tasks.    Time  8    Period  Weeks    Status  On-going      OT LONG TERM GOAL #4   Title  Pt will increase [facility]UE [facility] to improve ability to perform overhead reaching tasks at home and work.    Time  8    Period  Weeks    Status  On-going      OT LONG TERM GOAL #5   Title  Pt will improve LUE strength to 5/5 to increase ability to perform work tasks.    Time  8    Period  Weeks    Status  On-going            Plan - 07/27/19 1612    Clinical Impression Statement  A: Focused session on shoulder strengthening and endurance. Patient has severe difficulty with motion of external rotation both actively and with any light weight. Discussed with patient that with the type of damage and repair that he occured he Schnarr not get that motion back. VC for form and technique.    Body Structure / Function / Physical Skills  ADL;Endurance;UE functional use;Fascial restriction;Pain;Flexibility;ROM;IADL;Strength    Plan  P: Reassess and determine if discharge is appropriate.    Consulted and Agree with Plan of Care  Patient       Patient will benefit from skilled therapeutic intervention in order to improve the following deficits and impairments:   Body Structure / Function / Physical Skills: ADL, Endurance, UE functional use, Fascial restriction, Pain, Flexibility, ROM, IADL, Strength  Visit Diagnosis: Stiffness of left shoulder, not elsewhere classified  Acute pain of left shoulder  Other symptoms and signs involving the musculoskeletal system    Problem List Patient Active Problem List   Diagnosis Date Noted   . Complete rotator cuff rupture of left shoulder 04/05/2019   Ailene Ravel, OTR/L,CBIS  [phone]  07/27/2019, 4:26 PM  [facility] [address] [place], [place], [address] Phone: [phone]   Fax:  [phone]  Name: Henry Middleton MRN: [id number] Date of Birth: [birth year]

## 2019-08-01 ENCOUNTER — Ambulatory Visit (HOSPITAL_COMMUNITY): Payer: BC Managed Care – PPO

## 2019-08-01 ENCOUNTER — Telehealth (HOSPITAL_COMMUNITY): Payer: Self-pay | Admitting: Family Medicine

## 2019-08-01 NOTE — Telephone Encounter (Signed)
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

## 2019-08-03 ENCOUNTER — Ambulatory Visit (HOSPITAL_COMMUNITY): Payer: BC Managed Care – PPO

## 2019-08-03 ENCOUNTER — Other Ambulatory Visit: Payer: Self-pay

## 2019-08-03 ENCOUNTER — Encounter (HOSPITAL_COMMUNITY): Payer: Self-pay

## 2019-08-03 DIAGNOSIS — R29898 Other symptoms and signs involving the musculoskeletal system: Secondary | ICD-10-CM

## 2019-08-03 DIAGNOSIS — M25512 Pain in left shoulder: Secondary | ICD-10-CM

## 2019-08-03 DIAGNOSIS — M25612 Stiffness of left shoulder, not elsewhere classified: Secondary | ICD-10-CM

## 2019-08-03 NOTE — Patient Instructions (Signed)
Strengthening: Chest Pull - Resisted   Hold Theraband in front of body with hands about shoulder width a part. Pull band a part and back together slowly. Repeat __10-15__ times. Complete __1__ set(s) per session.. Repeat __1__ session(s) per day.  http://orth.exer.us/926   Copyright  VHI. All rights reserved.   PNF Strengthening: Resisted   Standing with resistive band around each hand, bring right arm up and away, thumb back. Repeat _10-15___ times per set. Do __1__ sets per session. Do ___1_ sessions per day.         Resisted External Rotation: in Neutral - Bilateral   Sit or stand, tubing in both hands, elbows at sides, bent to 90, forearms forward. Pinch shoulder blades together and rotate forearms out. Keep elbows at sides. Repeat __10-15__ times per set. Do __1__ sets per session. Do ___1_ sessions per day.  http://orth.exer.us/966   Copyright  VHI. All rights reserved.   PNF Strengthening: Resisted   Standing, hold resistive band above head. Bring right arm down and out from side. Repeat _10-15___ times per set. Do _1___ sets per session. Do ___1_ sessions per day.  http://orth.exer.us/922   Copyright  VHI. All rights reserved.  

## 2019-08-03 NOTE — Therapy (Signed)
[place], [place], [address] Phone: [phone]   Fax:  [phone]  Occupational Therapy Treatment reassessment Patient Details  Name: Henry Middleton MRN: [id number] Date of Birth: [birth year] Referring Provider (OT): Dr. Marchia Bond   Encounter Date: 08/03/2019  OT End of Session - 08/03/19 1818    Visit Number  19    Number of Visits  19    Date for OT Re-Evaluation  08/01/19    Authorization Type  [employer]    Authorization Time Period  No visit limit    OT Start Time  8366   pt arrived late   OT Stop Time  1723    OT Time Calculation (min)  33 min    Activity Tolerance  Patient tolerated treatment well    Behavior During Therapy  [facility] for tasks assessed/performed       Past Medical History:  Diagnosis Date  . Complete rotator cuff rupture of left shoulder 04/05/2019    Past Surgical History:  Procedure Laterality Date  . CORNEAL LACERATION REPAIR    . SHOULDER ARTHROSCOPY WITH BANKART REPAIR Left 04/05/2019   Procedure: SHOULDER ARTHROSCOPY WITH BANKART REPAIR;  Surgeon: Marchia Bond, MD;  Location: [place];  Service: Orthopedics;  Laterality: Left;  . SHOULDER ARTHROSCOPY WITH ROTATOR CUFF REPAIR AND SUBACROMIAL DECOMPRESSION Left 04/05/2019   Procedure: LEFT SHOULDER ARTHROSCOPY WITH DEBRIDEMENT, ROTATOR CUFF REPAIR, SUBACROMIAL DECOMPRESSION;  Surgeon: Marchia Bond, MD;  Location: [place];  Service: Orthopedics;  Laterality: Left;    There were no vitals filed for this visit.  Subjective Assessment - 08/03/19 1654    Subjective   S: I moved my arm a certain way to lick some food off at [facility] and that movement hurt.    Currently in Pain?  No/denies         [facility] OT Assessment - 08/03/19 1655      Assessment   Medical Diagnosis  s/p left RTC repair with bankart       Precautions   Precautions  Shoulder    Type of Shoulder Precautions   Progress as tolerated      ROM / Strength   AROM / PROM / Strength  AROM;PROM;Strength      AROM   Overall AROM Comments  Assessed seated, er/IR adducted    AROM Assessment Site  Shoulder    Right/Left Shoulder  Left    Left Shoulder Flexion  160 Degrees   previous: 150   Left Shoulder ABduction  163 Degrees   previous: 163   Left Shoulder Internal Rotation  90 Degrees   previous: same   Left Shoulder External Rotation  65 Degrees   previous: same     PROM   Overall PROM Comments  Assessed supine, er/IR adducted    PROM Assessment Site  Shoulder    Right/Left Shoulder  Left    Left Shoulder Flexion  180 Degrees   previous: 155   Left Shoulder ABduction  180 Degrees   previous: same   Left Shoulder Internal Rotation  90 Degrees   previous: same   Left Shoulder External Rotation  55 Degrees   previous: 50     Strength   Overall Strength Comments  Assessed seated, er/IR adducted    Strength Assessment Site  Shoulder    Right/Left Shoulder  Left    Left Shoulder Flexion  4/5   previous: 4-/5   Left Shoulder  ABduction  4+/5   previous: 3+/5   Left Shoulder Internal Rotation  5/5   previous: same   Left Shoulder External Rotation  4/5   previous: 4-/5                      OT Education - 08/03/19 1724    Education Details  reviewed progress in therapy. Updated HEP for shoulder strengthening with green band then progressing to blue band.    Person(s) Educated  Patient    Methods  Explanation    Comprehension  Verbalized understanding       OT Short Term Goals - 08/03/19 1709      OT SHORT TERM GOAL #1   Title  Pt will be provided with and educated on HEP to improve functional use of LUE during ADL completion.    Time  4    Period  Weeks    Status  Achieved    Target Date  07/02/19      OT SHORT TERM GOAL #2   Title  Pt will improve LUE P/ROM to WNL to improve ability to donn shirts with minimal compensatory strategies.    Time  4    Period   Weeks    Status  Achieved      OT SHORT TERM GOAL #3   Title  Pt will increase LUE strength to 4-/5 to improve ability to reach for items in low overhead cabinets.    Status  Achieved        OT Long Term Goals - 08/03/19 1710      OT LONG TERM GOAL #1   Title  Pt will achieve highest level of functioning using LUE as non-dominant during ADLs and work tasks.    Time  8    Period  Weeks    Status  Achieved      OT LONG TERM GOAL #2   Title  Pt will decrease pain in LUE to 2/10 or less to improve ability to sleep comfortably at night.    Time  8    Period  Weeks    Status  Achieved      OT LONG TERM GOAL #3   Title  Pt will decrease fascial restriction in LUE to minimal amounts or less to improve mobility required for functional reaching tasks.    Time  8    Period  Weeks    Status  Achieved      OT LONG TERM GOAL #4   Title  Pt will increase LUE A/ROM to [facility] to improve ability to perform overhead reaching tasks at home and work.    Time  8    Period  Weeks    Status  Achieved      OT LONG TERM GOAL #5   Title  Pt will improve LUE strength to 5/5 to increase ability to perform work tasks.    Time  8    Period  Weeks    Status  Partially Met            Plan - 08/03/19 1820    Clinical Impression Statement  A: Reassesment completed this date. patient has met all goals except his strength goal which he partially met. He is not demonstrating a 5/5 strength in all shoulder ranges although his strength is functional for daily and work related tasks. Pt reports that he knows his strength will need a little more time to get back to where  it was. He is independent with his updated HEP and is able to complete on his own. He reports that he only has pain when he moves too quickly and doesn't think about it. He is able to pick up a jug of oil know with his left arm.    Body Structure / Function / Physical Skills  ADL;Endurance;UE functional use;Fascial  restriction;Pain;Flexibility;ROM;IADL;Strength    Plan  P: D/C from OT services with HEP.    Consulted and Agree with Plan of Care  Patient       Patient will benefit from skilled therapeutic intervention in order to improve the following deficits and impairments:   Body Structure / Function / Physical Skills: ADL, Endurance, UE functional use, Fascial restriction, Pain, Flexibility, ROM, IADL, Strength       Visit Diagnosis: Acute pain of left shoulder  Stiffness of left shoulder, not elsewhere classified  Other symptoms and signs involving the musculoskeletal system    Problem List Patient Active Problem List   Diagnosis Date Noted  . Complete rotator cuff rupture of left shoulder 04/05/2019   OCCUPATIONAL THERAPY DISCHARGE SUMMARY  Visits from Start of Care: 19  Current functional level related to goals / functional outcomes: See above   Remaining deficits: See above   Education / Equipment: See above Plan: Patient agrees to discharge.  Patient goals were met. Patient is being discharged due to meeting the stated rehab goals.  ?????        Ailene Ravel, OTR/L,CBIS  [phone]   08/03/2019, 6:22 PM  [place], [place], [address] Phone: [phone]   Fax:  [phone]  Name: Henry Middleton MRN: [id number] Date of Birth: 10-Jan-1981

## 2019-08-08 ENCOUNTER — Ambulatory Visit (HOSPITAL_COMMUNITY): Payer: BC Managed Care – PPO | Admitting: Specialist

## 2019-08-10 ENCOUNTER — Ambulatory Visit (HOSPITAL_COMMUNITY): Payer: BC Managed Care – PPO

## 2019-12-02 ENCOUNTER — Other Ambulatory Visit: Payer: Self-pay

## 2019-12-02 ENCOUNTER — Ambulatory Visit: Payer: BC Managed Care – PPO | Attending: Internal Medicine

## 2019-12-02 DIAGNOSIS — Z20822 Contact with and (suspected) exposure to covid-19: Secondary | ICD-10-CM

## 2019-12-03 LAB — NOVEL CORONAVIRUS, NAA: SARS-CoV-2, NAA: NOT DETECTED

## 2020-10-14 IMAGING — CR ORBITS FOR FOREIGN BODY - 2 VIEW
2 series · 2 of 2 positions shown · non-contrast
Comparison: None.

CLINICAL DATA: Metal working/exposure; clearance prior to MRI.

EXAM:
ORBITS FOR FOREIGN BODY - 2 VIEW

[w orbit pa (1 of 2)]
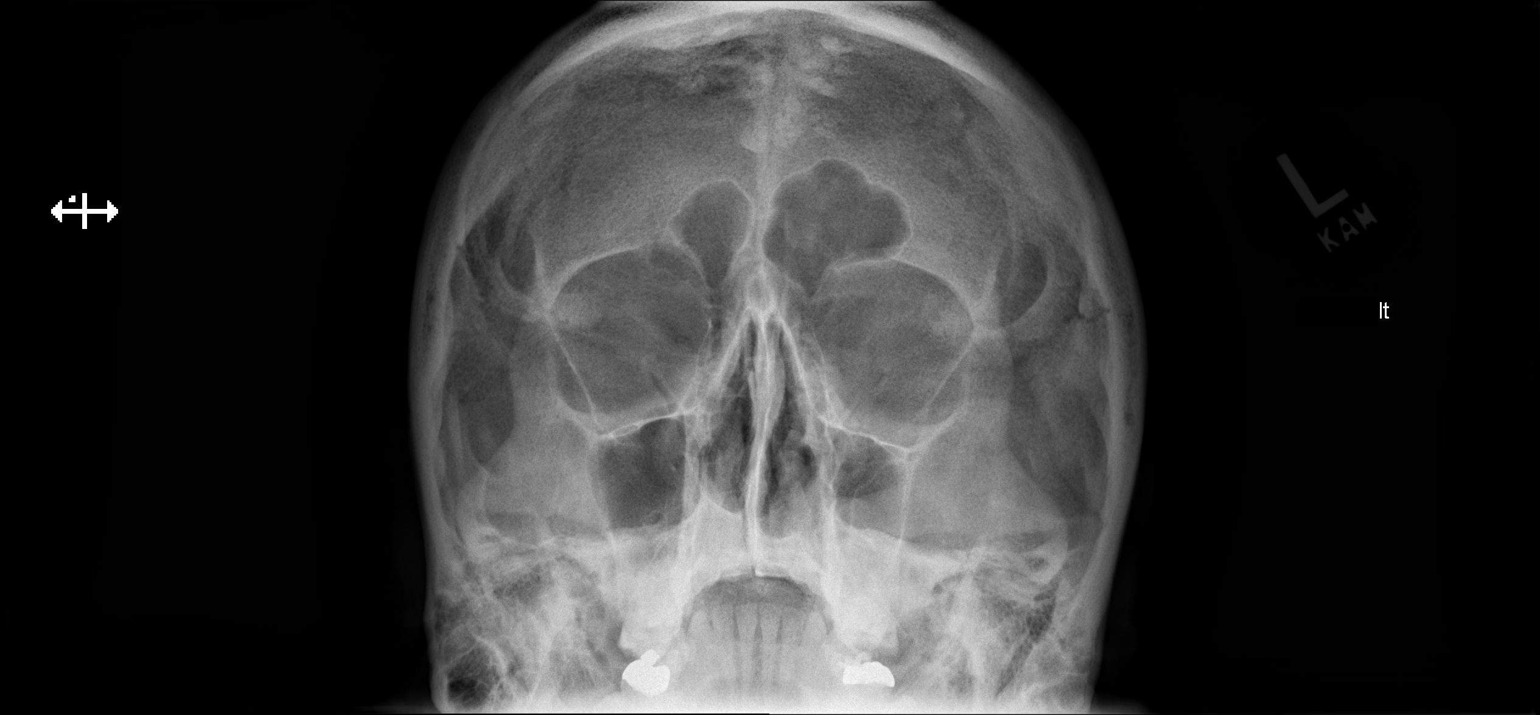

[w orbit pa (2 of 2)]
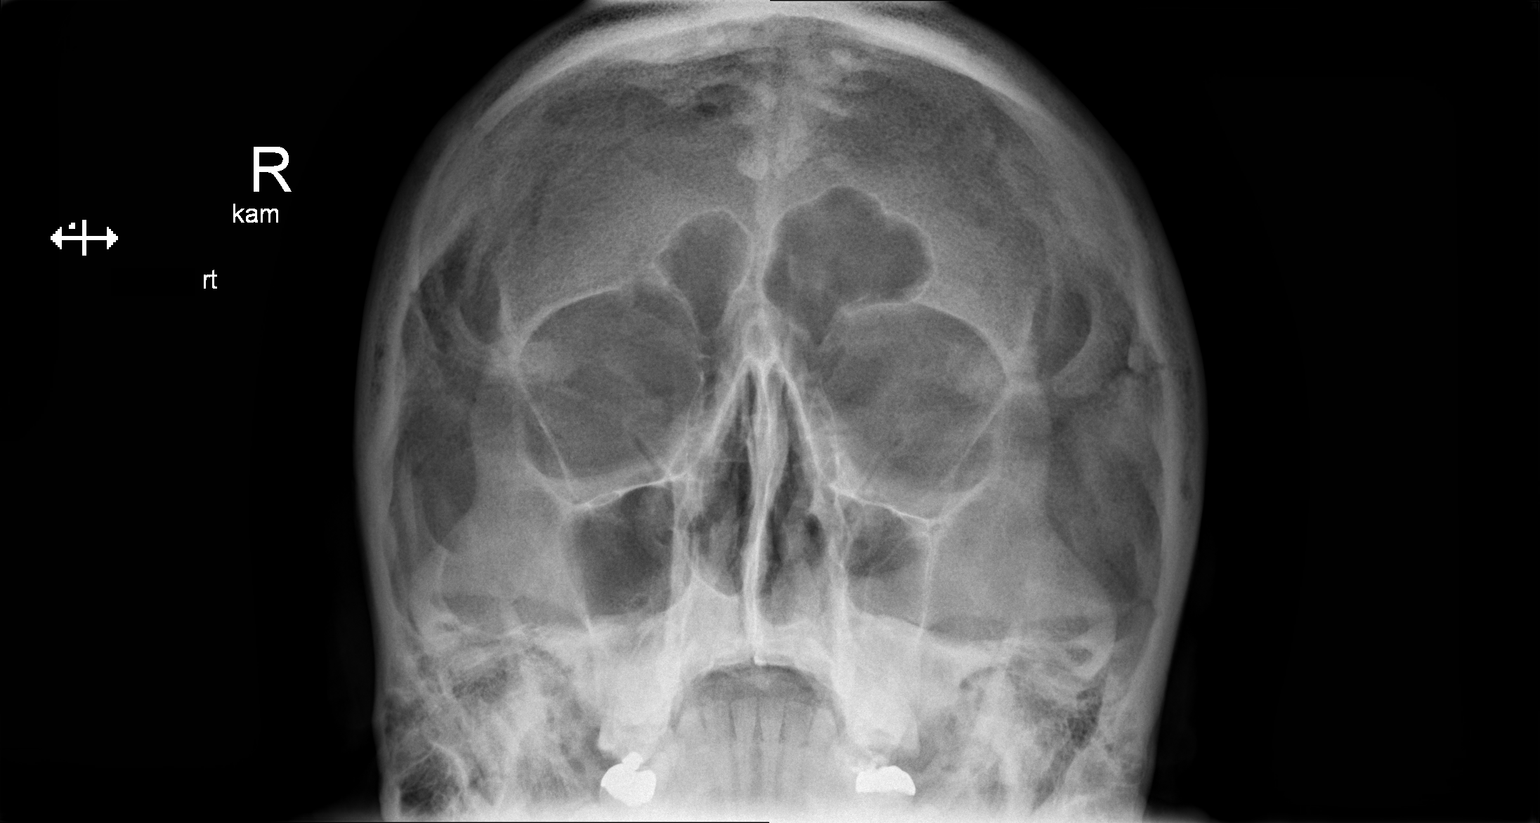

[2 of 2 positions shown; findings below may reference images not displayed]

FINDINGS: There is no evidence of metallic foreign body within the orbits. No
significant bone abnormality identified.
IMPRESSION: No evidence of metallic foreign body within the orbits.

## 2020-11-10 HISTORY — PX: OTHER SURGICAL HISTORY: SHX169

## 2020-12-03 ENCOUNTER — Other Ambulatory Visit: Payer: Self-pay

## 2020-12-03 ENCOUNTER — Encounter (HOSPITAL_COMMUNITY): Payer: Self-pay | Admitting: Emergency Medicine

## 2020-12-03 ENCOUNTER — Emergency Department (HOSPITAL_COMMUNITY)
Admission: EM | Admit: 2020-12-03 | Discharge: 2020-12-03 | Disposition: A | Discharge status: Home / Self Care | Payer: No Typology Code available for payment source | Attending: Emergency Medicine | Admitting: Emergency Medicine

## 2020-12-03 DIAGNOSIS — Z23 Encounter for immunization: Secondary | ICD-10-CM | POA: Diagnosis not present

## 2020-12-03 DIAGNOSIS — S6991XA Unspecified injury of right wrist, hand and finger(s), initial encounter: Secondary | ICD-10-CM | POA: Diagnosis present

## 2020-12-03 DIAGNOSIS — S61210A Laceration without foreign body of right index finger without damage to nail, initial encounter: Secondary | ICD-10-CM | POA: Insufficient documentation

## 2020-12-03 DIAGNOSIS — Y990 Civilian activity done for income or pay: Secondary | ICD-10-CM | POA: Diagnosis not present

## 2020-12-03 DIAGNOSIS — W268XXA Contact with other sharp object(s), not elsewhere classified, initial encounter: Secondary | ICD-10-CM | POA: Diagnosis not present

## 2020-12-03 MED ORDER — POVIDONE-IODINE 10 % EX SOLN
CUTANEOUS | Status: AC
  Filled 2020-12-03: qty 15

## 2020-12-03 MED ORDER — TETANUS-DIPHTH-ACELL PERTUSSIS 5-2.5-18.5 LF-MCG/0.5 IM SUSY
0.5000 mL | PREFILLED_SYRINGE | Freq: Once | INTRAMUSCULAR | Status: AC
  Administered 2020-12-03: 0.5 mL via INTRAMUSCULAR
  Filled 2020-12-03: qty 0.5

## 2020-12-03 MED ORDER — LIDOCAINE HCL (PF) 1 % IJ SOLN
10.0000 mL | Freq: Once | INTRAMUSCULAR | Status: AC
  Administered 2020-12-03: 10 mL
  Filled 2020-12-03: qty 30

## 2020-12-03 NOTE — Discharge Instructions (Signed)
Please follow up with Dr. Romeo Apple orthopedics given your finger laceration. You will need to have the sutures removed in 1 week; you can return to the ED for removal or they can be removed at Dr. Mort Sawyers office.   Attached is additional information on laceration repair. Keep wound clean and dry. Wear the splint for protection until you can have the sutures removed to prevent popping the sutures prematurely.   Return to the ED IMMEDIATELY for any signs of infection including redness/swelling around the wound, drainage of pus, fevers > 100.4, chills, or any other new/concerning symptoms.

## 2020-12-03 NOTE — ED Triage Notes (Signed)
Pt was working and had a piece of metal cut his right index finger.

## 2020-12-03 NOTE — ED Provider Notes (Signed)
[facility] EMERGENCY DEPARTMENT Provider Note   CSN: [id number] Arrival date & time: 12/03/20  5573     History Chief Complaint  Patient presents with  . Finger Injury    Henry Middleton is a 40 y.o. male who presents to the ED with a laceration to his right index finger that occurred just PTA. 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 He reports that when he bent his finger he noticed that the laceration seemed very deep and decided to come to the ED for further evaluation. Pt is unsure regarding his tetanus status. He has no other complaints at this time.   The history is provided by the patient and medical records.       Past Medical History:  Diagnosis Date  . Complete rotator cuff rupture of left shoulder 04/05/2019    Patient Active Problem List   Diagnosis Date Noted  . Complete rotator cuff rupture of left shoulder 04/05/2019    Past Surgical History:  Procedure Laterality Date  . CORNEAL LACERATION REPAIR    . SHOULDER ARTHROSCOPY WITH BANKART REPAIR Left 04/05/2019   Procedure: SHOULDER ARTHROSCOPY WITH BANKART REPAIR;  Surgeon: Teryl Lucy, MD;  Location: [facility];  Service: Orthopedics;  Laterality: Left;  . SHOULDER ARTHROSCOPY WITH ROTATOR CUFF REPAIR AND SUBACROMIAL DECOMPRESSION Left 04/05/2019   Procedure: LEFT SHOULDER ARTHROSCOPY WITH DEBRIDEMENT, ROTATOR CUFF REPAIR, SUBACROMIAL DECOMPRESSION;  Surgeon: Teryl Lucy, MD;  Location: [facility];  Service: Orthopedics;  Laterality: Left;       History reviewed. No pertinent family history.  Social History   Tobacco Use  . Smoking status: Never Smoker  . Smokeless tobacco: Never Used  Vaping Use  . Vaping Use: Never used  Substance Use Topics  . Alcohol use: Never  . Drug use: Never    Home Medications Prior to Admission medications   Medication Sig Start  Date End Date Taking? Authorizing Provider  baclofen (LIORESAL) 10 MG tablet Take 1 tablet (10 mg total) by mouth 3 (three) times daily. As needed for muscle spasm 04/05/19   Teryl Lucy, MD  ondansetron (ZOFRAN) 4 MG tablet Take 1 tablet (4 mg total) by mouth every 8 (eight) hours as needed for nausea or vomiting. 04/05/19   Teryl Lucy, MD  oxyCODONE (ROXICODONE) 5 MG immediate release tablet Take 1 tablet (5 mg total) by mouth every 4 (four) hours as needed for severe pain. 04/05/19   Teryl Lucy, MD  sennosides-docusate sodium (SENOKOT-S) 8.6-50 MG tablet Take 2 tablets by mouth daily. 04/05/19   Teryl Lucy, MD    Allergies    Patient has no known allergies.  Review of Systems   Review of Systems  Constitutional: Negative for chills and fever.  Musculoskeletal: Positive for arthralgias.  Skin: Positive for wound.  All other systems reviewed and are negative.   Physical Exam Updated Vital Signs BP (!) 177/108   Pulse 89   Temp 98.6 F (37 C) (Oral)   Resp 18   Ht 6\' 4"  (1.93 m)   Wt (!) 149.7 kg   SpO2 98%   BMI 40.17 kg/m   Physical Exam Vitals and nursing note reviewed.  Constitutional:      Appearance: He is not ill-appearing.  HENT:     Head: Normocephalic and atraumatic.  Eyes:     Conjunctiva/sclera: Conjunctivae normal.  Cardiovascular:     Rate and Rhythm: Normal rate and regular rhythm.  Pulmonary:     Effort: Pulmonary effort is normal.     Breath sounds: Normal breath sounds.  Musculoskeletal:     Comments: 2 cm laceration to the PIP joint of the right second finger along the dorsal aspect. ROM intact to PIP joint with flexion and extension. Cap refill < 2 seconds. 2+ radial pulse.   Skin:    General: Skin is warm and dry.     Coloration: Skin is not jaundiced.  Neurological:     Mental Status: He is alert.     ED Results / Procedures / Treatments   Labs (all labs ordered are listed, but only abnormal results are displayed) Labs Reviewed  - No data to display  EKG None  Radiology No results found.  Procedures .Marland KitchenLaceration Repair  Date/Time: 12/03/2020 11:17 AM Performed by: Tanda Rockers, PA-C Authorized by: Tanda Rockers, PA-C   Consent:    Consent obtained:  Verbal   Consent given by:  Patient   Risks discussed:  Infection, pain, poor cosmetic result, tendon damage and need for additional repair Anesthesia:    Anesthesia method:  Nerve block   Block needle gauge:  25 G   Block anesthetic:  Lidocaine 1% w/o epi   Block injection procedure:  Anatomic landmarks identified   Block outcome:  Anesthesia achieved Laceration details:    Location:  Finger   Finger location:  R index finger   Length (cm):  2   Depth (mm):  2 Pre-procedure details:    Preparation:  Patient was prepped and draped in usual sterile fashion Treatment:    Area cleansed with:  Povidone-iodine   Irrigation solution:  Sterile saline Skin repair:    Repair method:  Sutures   Suture size:  4-0   Suture material:  Prolene   Suture technique:  Simple interrupted   Number of sutures:  4 Approximation:    Approximation:  Close Repair type:    Repair type:  Intermediate Post-procedure details:    Dressing:  Splint for protection and non-adherent dressing   Procedure completion:  Tolerated well, no immediate complications   (including critical care time)  Medications Ordered in ED Medications  lidocaine (PF) (XYLOCAINE) 1 % injection 10 mL (10 mLs Infiltration Given 12/03/20 1017)  Tdap (BOOSTRIX) injection 0.5 mL (0.5 mLs Intramuscular Given 12/03/20 1013)  povidone-iodine (BETADINE) 10 % external solution (  Given 12/03/20 1015)    ED Course  I have reviewed the triage vital signs and the nursing notes.  Pertinent labs & imaging results that were available during my care of the patient were reviewed by me and considered in my medical decision making (see chart for details).    MDM Rules/Calculators/A&P                           40 year old male presents to the ED with laceration to his right index finger that occurred just prior to arrival while at work.  A large piece of metal sliced patient's finger after he was attempting to pry it off of a fire hydrant.  He is unsure regarding his tetanus status.  On arrival to the ED vitals are stable.  Patient does have a 2 cm laceration to the dorsal aspect of his right second finger along the PIP joint that will need sutures.  Will irrigate wound extensively and then placed sutures after a digital  block.  Will update tetanus at this time.   Pt tolerated procedure without difficulty. Splint applied. Pt instructed to follow up with Dr. Romeo Apple orthopedics for further evaluation. He can return to the ED for suture removal in 1 week. Pt instructed to return sooner for any signs of infection. He is recommended to wear the splint until sutures are removed. Pt is in agreement with plan and stable for discharge home.   This note was prepared using Dragon voice recognition software and Rampy include unintentional dictation errors due to the inherent limitations of voice recognition software.  Final Clinical Impression(s) / ED Diagnoses Final diagnoses:  Laceration of right index finger without foreign body without damage to nail, initial encounter    Rx / DC Orders ED Discharge Orders    None       Discharge Instructions     Please follow up with Dr. Romeo Apple orthopedics given your finger laceration. You will need to have the sutures removed in 1 week; you can return to the ED for removal or they can be removed at Dr. Mort Sawyers office.   Attached is additional information on laceration repair. Keep wound clean and dry. Wear the splint for protection until you can have the sutures removed to prevent popping the sutures prematurely.   Return to the ED IMMEDIATELY for any signs of infection including redness/swelling around the wound, drainage of pus, fevers > 100.4, chills, or any  other new/concerning symptoms.        Tanda Rockers, PA-C 12/03/20 1121    Vanetta Mulders, MD 12/16/20 [phone]

## 2022-06-26 ENCOUNTER — Ambulatory Visit
Admission: EM | Admit: 2022-06-26 | Discharge: 2022-06-26 | Disposition: A | Discharge status: Home / Self Care | Payer: BC Managed Care – PPO | Attending: Family Medicine | Admitting: Family Medicine

## 2022-06-26 ENCOUNTER — Encounter: Payer: Self-pay | Admitting: Emergency Medicine

## 2022-06-26 ENCOUNTER — Other Ambulatory Visit: Payer: Self-pay

## 2022-06-26 DIAGNOSIS — J0100 Acute maxillary sinusitis, unspecified: Secondary | ICD-10-CM

## 2022-06-26 DIAGNOSIS — H1031 Unspecified acute conjunctivitis, right eye: Secondary | ICD-10-CM

## 2022-06-26 MED ORDER — AMOXICILLIN 875 MG PO TABS: 875.0000 mg | ORAL_TABLET | Freq: Two times a day (BID) | ORAL | 0 refills | Status: AC

## 2022-06-26 MED ORDER — TOBRAMYCIN 0.3 % OP SOLN: 1.0000 [drp] | Freq: Four times a day (QID) | OPHTHALMIC | 0 refills | Status: DC

## 2022-06-26 NOTE — ED Provider Notes (Signed)
  [facility]ENTER   [id number] 06/26/22 Arrival Time: 9628  ASSESSMENT & PLAN:  1. Acute non-recurrent maxillary sinusitis   2. Acute conjunctivitis of right eye, unspecified acute conjunctivitis type    Begin: Meds ordered this encounter  Medications   amoxicillin (AMOXIL) 875 MG tablet    Sig: Take 1 tablet (875 mg total) by mouth 2 (two) times daily for 10 days.    Dispense:  20 tablet    Refill:  0   tobramycin (TOBREX) 0.3 % ophthalmic solution    Sig: Place 1 drop into the right eye every 6 (six) hours.    Dispense:  5 mL    Refill:  0   Work note provided. Discussed typical duration of symptoms. OTC symptom care as needed. Ensure adequate fluid intake and rest.   Follow-up Information     Pickard, Priscille Heidelberg, MD.   Specialty: Family Medicine Why: As needed. Contact information: [address] [place] [address] [phone]                 Reviewed expectations re: course of current medical issues. Questions answered. Outlined signs and symptoms indicating need for more acute intervention. Patient verbalized understanding. After Visit Summary given.   SUBJECTIVE: History from: patient.  Henry Middleton is a 41 y.o. male who presents with complaint of R-sided nasal congestion, post-nasal drainage, and sinus pain. Onset gradual, a week ago. Respiratory symptoms: none. Fever: absent. Overall normal PO intake without n/v. OTC treatment: various without much relief. Seasonal allergies: no. History of frequent sinus infections: no. No specific aggravating or alleviating factors reported. Social History   Tobacco Use  Smoking Status Never  Smokeless Tobacco Never    ROS: As per HPI.  OBJECTIVE:  Vitals:   06/26/22 0922  BP: (!) 159/94  Pulse: 85  Resp: 20  Temp: 98.8 F (37.1 C)  TempSrc: Oral  SpO2: 95%     General appearance: alert; no distress HEENT: nasal congestion; clear runny nose; throat irritation secondary to  post-nasal drainage; R-sided maxillary tenderness to palpation; turbinates boggy Neck: supple without LAD; trachea midline Lungs: unlabored respirations, no respiratory distress Skin: warm and dry Psychological: alert and cooperative; normal mood and affect  No Known Allergies  Past Medical History:  Diagnosis Date   Complete rotator cuff rupture of left shoulder 04/05/2019   History reviewed. No pertinent family history. Social History   Socioeconomic History   Marital status: Married    Spouse name: Not on file   Number of children: Not on file   Years of education: Not on file   Highest education level: Not on file  Occupational History   Not on file  Tobacco Use   Smoking status: Never   Smokeless tobacco: Never  Vaping Use   Vaping Use: Never used  Substance and Sexual Activity   Alcohol use: Never   Drug use: Never   Sexual activity: Not on file  Other Topics Concern   Not on file  Social History Narrative   Not on file   Social Determinants of Health   Financial Resource Strain: Not on file  Food Insecurity: Not on file  Transportation Needs: Not on file  Physical Activity: Not on file  Stress: Not on file  Social Connections: Not on file  Intimate Partner Violence: Not on file             Mardella Layman, MD 06/26/22 1031

## 2022-06-26 NOTE — ED Triage Notes (Signed)
Pt reports sinus/nasal congestion, right ear pressure x1 week. Pt reports has seen tele doc and px medication for cough, decongestants and reports some improvement in right sided facial pressure but reports this am woke up with right eye drainage.

## 2022-07-18 ENCOUNTER — Ambulatory Visit
Admission: EM | Admit: 2022-07-18 | Discharge: 2022-07-18 | Disposition: A | Discharge status: Home / Self Care | Payer: BC Managed Care – PPO | Attending: Emergency Medicine | Admitting: Emergency Medicine

## 2022-07-18 DIAGNOSIS — J320 Chronic maxillary sinusitis: Secondary | ICD-10-CM

## 2022-07-18 MED ORDER — AMOXICILLIN-POT CLAVULANATE 875-125 MG PO TABS: 1.0000 | ORAL_TABLET | Freq: Two times a day (BID) | ORAL | 0 refills | Status: AC

## 2022-07-18 MED ORDER — CEFTRIAXONE SODIUM 1 G IJ SOLR
1.0000 g | Freq: Once | INTRAMUSCULAR | Status: AC
  Administered 2022-07-18: 1 g via INTRAMUSCULAR

## 2022-07-18 MED ORDER — PREDNISONE 50 MG PO TABS: ORAL_TABLET | ORAL | 0 refills | Status: DC

## 2022-07-18 NOTE — Discharge Instructions (Addendum)
Try new antibiotic with steroid continue nasal lavage Motrin and Tylenol as needed for pain.  Throat sprays will also help.  Follow-up with the ENT if not improving over the next couple of weeks sooner if worse or new symptoms.  Add antihistamine like Zyrtec or Claritin and Flonase for better control of nasal secretions.

## 2022-07-18 NOTE — ED Triage Notes (Signed)
Pt reports sore throat, ear pain, sinus pressure, nasal congestion since 06/19/2022. Reports ear pain is worse when swallows. Reports he was doing better after doing a round amoxicillin.

## 2022-07-18 NOTE — ED Provider Notes (Signed)
[facility]   MRN: [id number] DOB: 03/25/1981  Subjective:   Chief Complaint;  Chief Complaint  Patient presents with   Otalgia   Sore Throat         Henry Middleton is a 41 y.o. male presenting for sore throat, sinus congestion ongoing for the last couple of weeks and ear pain.  He denies fever, nausea or vomiting.  Patient took whole course of antibiotics 2 weeks ago for sinus infection it did get a little better but then started to get worse again.  He states continuing to have green discharge in his nose.  He also has history of white coat syndrome but denies hypertension he is taking Mucinex and Sudafed and using home nasal lavage system  No current facility-administered medications for this encounter.  Current Outpatient Medications:    amoxicillin-clavulanate (AUGMENTIN) 875-125 MG tablet, Take 1 tablet by mouth every 12 (twelve) hours for 14 days., Disp: 28 tablet, Rfl: 0   predniSONE (DELTASONE) 50 MG tablet, Take 1 pill daily for 5 days as directed, Disp: 5 tablet, Rfl: 0   baclofen (LIORESAL) 10 MG tablet, Take 1 tablet (10 mg total) by mouth 3 (three) times daily. As needed for muscle spasm, Disp: 50 tablet, Rfl: 0   ondansetron (ZOFRAN) 4 MG tablet, Take 1 tablet (4 mg total) by mouth every 8 (eight) hours as needed for nausea or vomiting., Disp: 10 tablet, Rfl: 0   oxyCODONE (ROXICODONE) 5 MG immediate release tablet, Take 1 tablet (5 mg total) by mouth every 4 (four) hours as needed for severe pain., Disp: 30 tablet, Rfl: 0   sennosides-docusate sodium (SENOKOT-S) 8.6-50 MG tablet, Take 2 tablets by mouth daily., Disp: 30 tablet, Rfl: 1   tobramycin (TOBREX) 0.3 % ophthalmic solution, Place 1 drop into the right eye every 6 (six) hours., Disp: 5 mL, Rfl: 0   No Known Allergies  Past Medical History:  Diagnosis Date   Complete rotator cuff rupture of left shoulder 04/05/2019     Review of Systems  All other systems reviewed and are  negative.    Objective:   Vitals: BP (!) 173/102 (BP Location: Right Arm)   Pulse 71   Temp 97.9 F (36.6 C) (Oral)   Resp 18   SpO2 96%   Physical Exam Constitutional:      Appearance: Normal appearance. He is not toxic-appearing.  HENT:     Head: Atraumatic.     Comments: Pain is tender bilateral maxilla    Right Ear: Tympanic membrane and external ear normal.     Left Ear: Tympanic membrane and external ear normal.     Nose: Nose normal. No congestion.     Mouth/Throat:     Mouth: Mucous membranes are moist.     Pharynx: Uvula midline. Posterior oropharyngeal erythema present. No pharyngeal swelling or oropharyngeal exudate.  Eyes:     General: No scleral icterus.       Right eye: No discharge.        Left eye: No discharge.     Conjunctiva/sclera: Conjunctivae normal.     Comments: No conjunctival injection  Cardiovascular:     Rate and Rhythm: Normal rate and regular rhythm.  Pulmonary:     Effort: Pulmonary effort is normal. No respiratory distress.     Breath sounds: Normal breath sounds. No stridor. No wheezing, rhonchi or rales.  Chest:     Chest wall: No tenderness.  Musculoskeletal:     Cervical  back: Neck supple.  Lymphadenopathy:     Cervical: Cervical adenopathy present.  Skin:    General: Skin is warm and dry.  Neurological:     Mental Status: He is alert.  Psychiatric:        Mood and Affect: Mood normal.            Assessment and Plan :   1. Chronic maxillary sinusitis     Meds ordered this encounter  Medications   cefTRIAXone (ROCEPHIN) injection 1 g   amoxicillin-clavulanate (AUGMENTIN) 875-125 MG tablet    Sig: Take 1 tablet by mouth every 12 (twelve) hours for 14 days.    Dispense:  28 tablet    Refill:  0    Order Specific Question:   Supervising Provider    Answer:   Merrilee Jansky [[id number]]   predniSONE (DELTASONE) 50 MG tablet    Sig: Take 1 pill daily for 5 days as directed    Dispense:  5 tablet    Refill:  0     Order Specific Question:   Supervising Provider    Answer:   Merrilee Jansky [[id number]]    MDM:  Henry Middleton is a 41 y.o. male presenting for presenting for sore throat, sinus congestion ongoing for the last couple of weeks and ear pain on exam he appears fatigued and has an elevated blood pressure.  Patient did use Sudafed he was advised against this.  He has no respiratory distress or injection of the TMs.  There is injection of the posterior pharynx without gross swelling or exudate.  Mild lymphadenopathy cervical anterior chains bilateral and tenderness to the maxillary sinuses bilateral.  Patient has had ongoing green discharge from his sinuses we will treat with Augmentin as a change up from last visit with amoxicillin.  Will add steroid and patient will continue other regime adding antihistamine.  Follow-up with ENT will be helpful if not improving over the next couple of weeks s.  I discussed todays findings, treatment plan, follow up and return instructions. Questions were answered. Patient/representative stated understanding of the instructions and patient is stable for discharge.  Dewaine Conger FNP-C MSN     Jone Baseman, NP 07/18/22 [phone]

## 2023-07-20 ENCOUNTER — Ambulatory Visit
Admission: EM | Admit: 2023-07-20 | Discharge: 2023-07-20 | Disposition: A | Discharge status: Home / Self Care | Payer: BC Managed Care – PPO | Attending: Nurse Practitioner | Admitting: Nurse Practitioner

## 2023-07-20 DIAGNOSIS — R739 Hyperglycemia, unspecified: Secondary | ICD-10-CM

## 2023-07-20 LAB — POCT URINALYSIS DIP (MANUAL ENTRY)
Blood, UA: NEGATIVE
Glucose, UA: 500 mg/dL — AB
Leukocytes, UA: NEGATIVE
Nitrite, UA: NEGATIVE
Protein Ur, POC: NEGATIVE mg/dL
Spec Grav, UA: 1.025 (ref 1.010–1.025)
Urobilinogen, UA: 0.2 U/dL
pH, UA: 5 (ref 5.0–8.0)

## 2023-07-20 LAB — POCT FASTING CBG KUC MANUAL ENTRY: POCT Glucose (KUC): 249 mg/dL — AB (ref 70–99)

## 2023-07-20 NOTE — ED Provider Notes (Signed)
[facility]    CSN: [id number] Arrival date & time: 07/20/23  0856      History   Chief Complaint Chief Complaint  Patient presents with   Blood Sugar Problem    HPI Henry Middleton is a 42 y.o. male.   The history is provided by the patient.   Patient presents for concerns regarding his blood sugars.  Patient reports over the last several weeks, he has experienced weight loss, excessive thirst, and excessive urination.  Patient states that he also has been stressed for quite some time, and felt that his symptoms Demps be related to that.  Patient states that one of his friends checked his blood glucose level 2 days ago, and at that time it was 544.  He states he did not feel any different that he normally does.  States this morning, blood sugar was 271 at home.  Patient denies fever, chills, chest pain, abdominal pain, nausea, vomiting, or diarrhea.  Patient reports that he believes most of his symptoms were related to the increased stress that he is under, between his job and building a new home.  Patient reports that there is a maternal familiar history of diabetes.  Past Medical History:  Diagnosis Date   Complete rotator cuff rupture of left shoulder 04/05/2019    Patient Active Problem List   Diagnosis Date Noted   Complete rotator cuff rupture of left shoulder 04/05/2019    Past Surgical History:  Procedure Laterality Date   CORNEAL LACERATION REPAIR     SHOULDER ARTHROSCOPY WITH BANKART REPAIR Left 04/05/2019   Procedure: SHOULDER ARTHROSCOPY WITH BANKART REPAIR;  Surgeon: Teryl Lucy, MD;  Location: [facility];  Service: Orthopedics;  Laterality: Left;   SHOULDER ARTHROSCOPY WITH ROTATOR CUFF REPAIR AND SUBACROMIAL DECOMPRESSION Left 04/05/2019   Procedure: LEFT SHOULDER ARTHROSCOPY WITH DEBRIDEMENT, ROTATOR CUFF REPAIR, SUBACROMIAL DECOMPRESSION;  Surgeon: Teryl Lucy, MD;  Location: [facility];  Service: Orthopedics;   Laterality: Left;       Home Medications    Prior to Admission medications   Medication Sig Start Date End Date Taking? Authorizing Provider  baclofen (LIORESAL) 10 MG tablet Take 1 tablet (10 mg total) by mouth 3 (three) times daily. As needed for muscle spasm 04/05/19   Teryl Lucy, MD  ondansetron (ZOFRAN) 4 MG tablet Take 1 tablet (4 mg total) by mouth every 8 (eight) hours as needed for nausea or vomiting. 04/05/19   Teryl Lucy, MD  oxyCODONE (ROXICODONE) 5 MG immediate release tablet Take 1 tablet (5 mg total) by mouth every 4 (four) hours as needed for severe pain. 04/05/19   Teryl Lucy, MD  predniSONE (DELTASONE) 50 MG tablet Take 1 pill daily for 5 days as directed 07/18/22   Jone Baseman, NP  sennosides-docusate sodium (SENOKOT-S) 8.6-50 MG tablet Take 2 tablets by mouth daily. 04/05/19   Teryl Lucy, MD  tobramycin (TOBREX) 0.3 % ophthalmic solution Place 1 drop into the right eye every 6 (six) hours. 06/26/22   Mardella Layman, MD    Family History History reviewed. No pertinent family history.  Social History Social History   Tobacco Use   Smoking status: Never   Smokeless tobacco: Never  Vaping Use   Vaping status: Never Used  Substance Use Topics   Alcohol use: Never   Drug use: Never     Allergies   Patient has no known allergies.   Review of Systems Review of Systems Per HPI  Physical Exam  Triage Vital Signs ED Triage Vitals  Encounter Vitals Group     BP 07/20/23 1049 (!) 158/98     Systolic BP Percentile --      Diastolic BP Percentile --      Pulse Rate 07/20/23 1049 85     Resp 07/20/23 1049 16     Temp 07/20/23 1049 97.8 F (36.6 C)     Temp Source 07/20/23 1049 Oral     SpO2 07/20/23 1049 97 %     Weight --      Height --      Head Circumference --      Peak Flow --      Pain Score 07/20/23 1050 0     Pain Loc --      Pain Education --      Exclude from Growth Chart --    No data found.  Updated Vital Signs BP (!)  158/98 (BP Location: Right Arm)   Pulse 85   Temp 97.8 F (36.6 C) (Oral)   Resp 16   Wt 270 lb 3.2 oz (122.6 kg)   SpO2 97%   BMI 32.89 kg/m   Visual Acuity Right Eye Distance:   Left Eye Distance:   Bilateral Distance:    Right Eye Near:   Left Eye Near:    Bilateral Near:     Physical Exam Vitals and nursing note reviewed.  Constitutional:      General: He is not in acute distress.    Appearance: Normal appearance.  HENT:     Head: Normocephalic.  Eyes:     Extraocular Movements: Extraocular movements intact.     Pupils: Pupils are equal, round, and reactive to light.  Cardiovascular:     Rate and Rhythm: Normal rate and regular rhythm.     Pulses: Normal pulses.     Heart sounds: Normal heart sounds.  Pulmonary:     Effort: Pulmonary effort is normal.     Breath sounds: Normal breath sounds.  Abdominal:     General: Bowel sounds are normal.     Palpations: Abdomen is soft.  Musculoskeletal:     Cervical back: Normal range of motion.  Skin:    General: Skin is warm and dry.  Neurological:     General: No focal deficit present.     Mental Status: He is alert and oriented to person, place, and time.  Psychiatric:        Mood and Affect: Mood normal.        Behavior: Behavior normal.      UC Treatments / Results  Labs (all labs ordered are listed, but only abnormal results are displayed) Labs Reviewed  POCT FASTING CBG [facility] MANUAL ENTRY - Abnormal; Notable for the following components:      Result Value   POCT Glucose ([facility]) 249 (*)    All other components within normal limits  POCT URINALYSIS DIP (MANUAL ENTRY) - Abnormal; Notable for the following components:   Glucose, UA =500 (*)    Bilirubin, UA small (*)    Ketones, POC UA >= (160) (*)    All other components within normal limits  CBC WITH DIFFERENTIAL/PLATELET  COMPREHENSIVE METABOLIC PANEL    EKG   Radiology No results found.  Procedures Procedures (including critical care  time)  Medications Ordered in UC Medications - No data to display  Initial Impression / Assessment and Plan / UC Course  I have reviewed the triage vital signs and the nursing  notes.  Pertinent labs & imaging results that were available during my care of the patient were reviewed by me and considered in my medical decision making (see chart for details).  The patient is well-appearing, he is in no acute distress, vital signs are stable.  Patient's current symptoms and blood glucose level consistent with type 2 diabetes.  CBC and CMP are pending to evaluate for safety and to check patient's renal function.  If renal function is elevated or other abnormal labs were found, we will have patient go to the emergency department once test results are received.  Patient is in no acute distress at this time.  Fingerstick blood glucose was 249 in the office today.  Will have patient increase his fluid intake, appointment with primary care scheduled for 07/22/2023 for further workup and evaluation.  Patient was advised to continue to monitor his blood glucose level if possible.  Advised patient to refrain from eating excessive amounts of carbohydrates, specifically simple sugars.  Patient was given strict ER follow-up precautions.  Patient is in agreement with this plan of care and verbalizes understanding.  All questions were answered.  Patient stable for discharge.  Work note was provided.  Final Clinical Impressions(s) / UC Diagnoses   Final diagnoses:  Blood glucose elevated  Hyperglycemia     Discharge Instructions      Your symptoms are consistent with type 2 diabetes. Blood work is pending.  You will be contacted if the results of the blood work are abnormal.  If your kidney function is abnormal, we will contact you and have you follow-up in the emergency department. Try to drink at least 8-10 8 ounce glasses of water daily. Try to avoid excessive amounts of carbohydrates to include simple  sugars such as sodas, junk food, pizza, cookies, or chips.   Continue checking your blood glucose levels until you have followed up with primary care.  If your blood glucose level exceeds 300, or if you develop nausea, vomiting, excessive urination, or other concerns please go to the emergency department immediately for further evaluation. Keep appointment scheduled with primary care for 07/22/2023. Follow-up as needed.      ED Prescriptions   None    PDMP not reviewed this encounter.   Abran Cantor, NP 07/20/23 1148

## 2023-07-20 NOTE — ED Triage Notes (Signed)
Pt states he has lost 80 lbs over 2-3 months, with increase thirst, blurry vision, pt friend checked his blood sugar Saturday and states it was in the 500`s.

## 2023-07-20 NOTE — Discharge Instructions (Addendum)
Your symptoms are consistent with type 2 diabetes. Blood work is pending.  You will be contacted if the results of the blood work are abnormal.  If your kidney function is abnormal, we will contact you and have you follow-up in the emergency department. Try to drink at least 8-10 8 ounce glasses of water daily. Try to avoid excessive amounts of carbohydrates to include simple sugars such as sodas, junk food, pizza, cookies, or chips.   Continue checking your blood glucose levels until you have followed up with primary care.  If your blood glucose level exceeds 300, or if you develop nausea, vomiting, excessive urination, or other concerns please go to the emergency department immediately for further evaluation. Keep appointment scheduled with primary care for 07/22/2023. Follow-up as needed.

## 2023-07-21 LAB — CBC WITH DIFFERENTIAL/PLATELET
Basophils Absolute: 0.1 10*3/uL (ref 0.0–0.2)
Basos: 1 %
EOS (ABSOLUTE): 0.1 10*3/uL (ref 0.0–0.4)
Eos: 1 %
Hematocrit: 51.1 % — ABNORMAL HIGH (ref 37.5–51.0)
Hemoglobin: 17.2 g/dL (ref 13.0–17.7)
Immature Grans (Abs): 0 10*3/uL (ref 0.0–0.1)
Immature Granulocytes: 0 %
Lymphocytes Absolute: 3.5 10*3/uL — ABNORMAL HIGH (ref 0.7–3.1)
Lymphs: 37 %
MCH: 33.1 pg — ABNORMAL HIGH (ref 26.6–33.0)
MCHC: 33.7 g/dL (ref 31.5–35.7)
MCV: 98 fL — ABNORMAL HIGH (ref 79–97)
Monocytes Absolute: 0.6 10*3/uL (ref 0.1–0.9)
Monocytes: 6 %
Neutrophils Absolute: 5.2 10*3/uL (ref 1.4–7.0)
Neutrophils: 55 %
Platelets: 211 10*3/uL (ref 150–450)
RBC: 5.2 x10E6/uL (ref 4.14–5.80)
RDW: 12 % (ref 11.6–15.4)
WBC: 9.4 10*3/uL (ref 3.4–10.8)

## 2023-07-21 LAB — COMPREHENSIVE METABOLIC PANEL
ALT: 29 IU/L (ref 0–44)
AST: 20 IU/L (ref 0–40)
Albumin: 4.4 g/dL (ref 4.1–5.1)
Alkaline Phosphatase: 76 IU/L (ref 44–121)
BUN/Creatinine Ratio: 25 — ABNORMAL HIGH (ref 9–20)
BUN: 17 mg/dL (ref 6–24)
Bilirubin Total: 0.7 mg/dL (ref 0.0–1.2)
CO2: 22 mmol/L (ref 20–29)
Calcium: 9.7 mg/dL (ref 8.7–10.2)
Chloride: 99 mmol/L (ref 96–106)
Creatinine, Ser: 0.67 mg/dL — ABNORMAL LOW (ref 0.76–1.27)
Globulin, Total: 2.5 g/dL (ref 1.5–4.5)
Glucose: 252 mg/dL — ABNORMAL HIGH (ref 70–99)
Potassium: 4.3 mmol/L (ref 3.5–5.2)
Sodium: 139 mmol/L (ref 134–144)
Total Protein: 6.9 g/dL (ref 6.0–8.5)
eGFR: 120 mL/min/{1.73_m2} (ref >59)

## 2023-07-22 ENCOUNTER — Encounter (HOSPITAL_BASED_OUTPATIENT_CLINIC_OR_DEPARTMENT_OTHER): Payer: Self-pay | Admitting: Family Medicine

## 2023-07-22 ENCOUNTER — Ambulatory Visit (HOSPITAL_BASED_OUTPATIENT_CLINIC_OR_DEPARTMENT_OTHER): Payer: BC Managed Care – PPO | Admitting: Family Medicine

## 2023-07-22 VITALS — BP 157/100 | HR 82 | Ht 76.0 in | Wt 274.3 lb

## 2023-07-22 DIAGNOSIS — R030 Elevated blood-pressure reading, without diagnosis of hypertension: Secondary | ICD-10-CM | POA: Diagnosis not present

## 2023-07-22 DIAGNOSIS — Z7689 Persons encountering health services in other specified circumstances: Secondary | ICD-10-CM | POA: Diagnosis not present

## 2023-07-22 DIAGNOSIS — E119 Type 2 diabetes mellitus without complications: Secondary | ICD-10-CM | POA: Diagnosis not present

## 2023-07-22 LAB — POCT GLYCOSYLATED HEMOGLOBIN (HGB A1C): Hemoglobin A1C: 13.9 % — AB (ref 4.0–5.6)

## 2023-07-22 MED ORDER — LANTUS SOLOSTAR 100 UNIT/ML ~~LOC~~ SOPN: 10.0000 [IU] | PEN_INJECTOR | Freq: Every day | SUBCUTANEOUS | 3 refills | Status: DC

## 2023-07-22 NOTE — Patient Instructions (Signed)
LONG ACTING INSULIN INSTRUCTIONS  Check your blood sugar every day. It would be best if you could keep a notebook of dates, blood sugar values, and how much insulin you use. Bring this to your next visit.   If your blood sugar is less than 100, subtract 2 units from the previous day's dose.   If your sugar is between 100-200, you will give yourself the same dose of insulin as the previous day.   If your blood sugar is over 200, you should add 2 units to your previous day's dose.   - For Example: Yesterday you gave yourself 20 units of Insulin. Today your blood sugar is 235. You should give yourself 22 units today. If the next day your sugar is 215, you would give yourself 24 units total.     Sugar    Insulin Dosing  0-100    Subtract 2 units from previous days' dose 101-200   Keep the same dose as the previous day 200 or more   Add 2 units to the previous days dose.    *starting at 10 units  Based on current blood sugar readings, remain on adjusted dose at the new dose

## 2023-07-22 NOTE — Progress Notes (Signed)
New Patient Office Visit  Subjective    Patient ID: Henry Middleton, male    DOB: 1980/11/26  Age: 42 y.o. MRN: [id number]  HPI Henry Middleton is a 42 year old male who presents to establish care. He has concerns regarding his recent blood sugar readings. Reports it has been in the 300-400s; lowest it has been is in the 200s. Highest reading was 544. Recently saw UC on 9/9 for weight loss (75 lbs) since the beginning of summer, excessive thirst, and excessive urination since summer started. Blood glucose there was 249. UA remarkable for glucose and ketones.  Patient is surprised at this diagnosis. "Never had any issues like this. Thought thirst was due to the hot weather and working outside." Today- A1c 13.9   Outpatient Encounter Medications as of 07/22/2023  Medication Sig   insulin glargine (LANTUS SOLOSTAR) 100 UNIT/ML Solostar Pen Inject 10 Units into the skin at bedtime.   baclofen (LIORESAL) 10 MG tablet Take 1 tablet (10 mg total) by mouth 3 (three) times daily. As needed for muscle spasm (Patient not taking: Reported on 07/22/2023)   ondansetron (ZOFRAN) 4 MG tablet Take 1 tablet (4 mg total) by mouth every 8 (eight) hours as needed for nausea or vomiting. (Patient not taking: Reported on 07/22/2023)   oxyCODONE (ROXICODONE) 5 MG immediate release tablet Take 1 tablet (5 mg total) by mouth every 4 (four) hours as needed for severe pain. (Patient not taking: Reported on 07/22/2023)   predniSONE (DELTASONE) 50 MG tablet Take 1 pill daily for 5 days as directed (Patient not taking: Reported on 07/22/2023)   sennosides-docusate sodium (SENOKOT-S) 8.6-50 MG tablet Take 2 tablets by mouth daily. (Patient not taking: Reported on 07/22/2023)   tobramycin (TOBREX) 0.3 % ophthalmic solution Place 1 drop into the right eye every 6 (six) hours. (Patient not taking: Reported on 07/22/2023)   No facility-administered encounter medications on file as of 07/22/2023.    Past Medical History:  Diagnosis Date    Complete rotator cuff rupture of left shoulder 04/05/2019    Past Surgical History:  Procedure Laterality Date   CORNEAL LACERATION REPAIR     Digit Reattachment Right 2022   right pointer finger   SHOULDER ARTHROSCOPY WITH BANKART REPAIR Left 04/05/2019   Procedure: SHOULDER ARTHROSCOPY WITH BANKART REPAIR;  Surgeon: Teryl Lucy, MD;  Location: [facility];  Service: Orthopedics;  Laterality: Left;   SHOULDER ARTHROSCOPY WITH ROTATOR CUFF REPAIR AND SUBACROMIAL DECOMPRESSION Left 04/05/2019   Procedure: LEFT SHOULDER ARTHROSCOPY WITH DEBRIDEMENT, ROTATOR CUFF REPAIR, SUBACROMIAL DECOMPRESSION;  Surgeon: Teryl Lucy, MD;  Location: [facility];  Service: Orthopedics;  Laterality: Left;    History reviewed. No pertinent family history.   Review of Systems  Constitutional:  Positive for malaise/fatigue and weight loss.  Respiratory:  Negative for cough and shortness of breath.   Cardiovascular:  Negative for chest pain, palpitations and leg swelling.  Gastrointestinal:  Negative for abdominal pain, nausea and vomiting.  Genitourinary:  Positive for frequency and urgency. Negative for hematuria.  Musculoskeletal:  Negative for myalgias.  Neurological:  Negative for dizziness, weakness and headaches.  Endo/Heme/Allergies:  Positive for polydipsia.  Psychiatric/Behavioral:  Negative for depression, substance abuse and suicidal ideas. The patient is not nervous/anxious.     Objective    BP (!) 157/100   Pulse 82   Ht 6\' 4"  (1.93 m)   Wt 274 lb 4.8 oz (124.4 kg)   SpO2 100%   BMI 33.39 kg/m  Physical Exam Constitutional:      Appearance: Normal appearance.  Cardiovascular:     Rate and Rhythm: Normal rate and regular rhythm.     Pulses: Normal pulses.     Heart sounds: Normal heart sounds.  Pulmonary:     Effort: Pulmonary effort is normal.     Breath sounds: Normal breath sounds.  Neurological:     Mental Status: He is alert.   Psychiatric:        Mood and Affect: Mood normal.        Behavior: Behavior normal.        Thought Content: Thought content normal.        Judgment: Judgment normal.     Assessment & Plan:   1. Encounter to establish care Patient is a 42 year-old male who presents today to establish care with primary care provider. Reviewed the past medical history, family history, social history, surgical history, medications and allergies today- updates made as indicated. Patient has concerns today about his elevated blood sugar readings.    2. Diabetes mellitus without complication (HCC) Patient presents to the office today with home blood glucose readings in the 300-400s. Reports weight loss (75 lbs in the past few months), polyuria, and polydipsia. Hemoglobin A1c today 13.9. Discussed lifestyle modifications- including healthy food choices and daily exercise. Provided extensive education regarding importance of controlling blood sugar levels with insulin at this time, rotating injection sites, frequency of blood glucose monitoring, target blood glucose range, symptoms and management of hypoglycemia, and starting long-acting insulin dose at 10 units at bedtime. Provided patient handout to record home blood sugar recordings and instructions regarding long-acting insulin. Patient verbalized understanding of plan of care and will return to office in 2 weeks.   - POCT HgB A1C - insulin glargine (LANTUS SOLOSTAR) 100 UNIT/ML Solostar Pen; Inject 10 Units into the skin at bedtime.  Dispense: 3 mL; Refill: 3  3. Elevated blood pressure reading in office without diagnosis of hypertension Patient presents today with elevated blood pressure. Patient in no acute distress and is well-appearing. Denies chest pain, shortness of breath, lower extremity edema, vision changes, headaches. Cardiovascular exam with heart regular rate and rhythm. Normal heart sounds, no murmurs present. No lower extremity edema present. Lungs  clear to auscultation bilaterally. Most likely related to DM. Will recheck blood pressure at next visit.   Return in about 2 weeks (around 08/05/2023) for Diabetes f/u.   Alyson Reedy, FNP

## 2023-07-28 ENCOUNTER — Other Ambulatory Visit (HOSPITAL_BASED_OUTPATIENT_CLINIC_OR_DEPARTMENT_OTHER): Payer: Self-pay | Admitting: Family Medicine

## 2023-08-03 ENCOUNTER — Telehealth (HOSPITAL_BASED_OUTPATIENT_CLINIC_OR_DEPARTMENT_OTHER): Payer: Self-pay | Admitting: Family Medicine

## 2023-08-03 NOTE — Telephone Encounter (Signed)
Lvm for patient to r/s 08/06/23 appt, Provider out of office.

## 2023-08-05 ENCOUNTER — Encounter (HOSPITAL_BASED_OUTPATIENT_CLINIC_OR_DEPARTMENT_OTHER): Payer: Self-pay | Admitting: Family Medicine

## 2023-08-05 ENCOUNTER — Ambulatory Visit (HOSPITAL_BASED_OUTPATIENT_CLINIC_OR_DEPARTMENT_OTHER): Payer: BC Managed Care – PPO | Admitting: Family Medicine

## 2023-08-05 ENCOUNTER — Other Ambulatory Visit (HOSPITAL_BASED_OUTPATIENT_CLINIC_OR_DEPARTMENT_OTHER): Payer: Self-pay

## 2023-08-05 VITALS — BP 156/98 | HR 73 | Ht 76.0 in | Wt 286.5 lb

## 2023-08-05 DIAGNOSIS — E119 Type 2 diabetes mellitus without complications: Secondary | ICD-10-CM

## 2023-08-05 DIAGNOSIS — E1165 Type 2 diabetes mellitus with hyperglycemia: Secondary | ICD-10-CM | POA: Diagnosis not present

## 2023-08-05 DIAGNOSIS — I10 Essential (primary) hypertension: Secondary | ICD-10-CM | POA: Diagnosis not present

## 2023-08-05 MED ORDER — LOSARTAN POTASSIUM 25 MG PO TABS
25.0000 mg | ORAL_TABLET | Freq: Every day | ORAL | 3 refills | Status: AC
Start: 2023-08-05 — End: ?

## 2023-08-05 MED ORDER — OZEMPIC (0.25 OR 0.5 MG/DOSE) 2 MG/3ML ~~LOC~~ SOPN: 0.2500 mg | PEN_INJECTOR | SUBCUTANEOUS | 3 refills | Status: DC

## 2023-08-05 MED ORDER — METFORMIN HCL 500 MG PO TABS: 500.0000 mg | ORAL_TABLET | Freq: Two times a day (BID) | ORAL | 3 refills | Status: DC

## 2023-08-05 MED ORDER — OZEMPIC (0.25 OR 0.5 MG/DOSE) 2 MG/3ML ~~LOC~~ SOPN
0.5000 mg | PEN_INJECTOR | SUBCUTANEOUS | 3 refills | Status: AC
Start: 2023-08-05 — End: ?

## 2023-08-05 NOTE — Patient Instructions (Signed)
[facility] [address] [place], [place], [address] Tel: [phone]  Call to make appt for diabetic eye exam.

## 2023-08-05 NOTE — Progress Notes (Signed)
Subjective:   Henry Middleton 15-May-1981 08/05/2023  Chief Complaint  Patient presents with   Diabetes    Patient is here today following up for diabetes. States that his vision became blurry after doing his first injection of insulin. States that it is still happening.    HPI: Henry Middleton presents today for re-assessment and management of chronic medical conditions.  DIABETES MELLITUS: Henry Middleton presents for the medical management of diabetes.  Current diabetes medication regimen: Insulin Glargine 10 units Patient is  checking their feet regularly.  Denies polydipsia, polyphagia, polyuria, open wounds or ulcers on feet.  He is eating "mostly meat and veggies" and limiting carbohydrates. His glucose readings started on 9/12-9/18 with average around 250. Readings between 9/20-9/24 have average of 180.   Pt states he does have impaired vision from a prior injury with metal to left eye and has known cataract. However, he states since starting the Insulin nightly injection he has noticed when becoming "angry and stressed over work" his vision became blurry. He states with using his glasses he has no issue with vision. He states if wearing his glasses (reading) and taking them off after several minutes he notices increase in blurred vision.   Lab Results  Component Value Date   HGBA1C 13.9 (A) 07/22/2023    No foot exam found No results found for: "LABMICR", "MICROALBUR"  Wt Readings from Last 3 Encounters:  08/05/23 286 lb 8 oz (130 kg)  07/22/23 274 lb 4.8 oz (124.4 kg)  07/20/23 270 lb 3.2 oz (122.6 kg)     HYPERTENSION: Henry Middleton presents for the medical management of hypertension.  Patient's current hypertension medication regimen is: None Patient is not regularly keeping a check on BP at home.  Adhering to low sodium diet: Yes Exercising Regularly: Yes Denies headache, dizziness, CP, SHOB, vision changes.   BP Readings from Last 3 Encounters:  08/05/23 (!)  156/98  07/22/23 (!) 157/100  07/20/23 (!) 158/98     The following portions of the patient's history were reviewed and updated as appropriate: past medical history, past surgical history, family history, social history, allergies, medications, and problem list.   Patient Active Problem List   Diagnosis Date Noted   Diabetes mellitus without complication (HCC) 07/22/2023   Elevated blood pressure reading in office without diagnosis of hypertension 07/22/2023   Complete rotator cuff rupture of left shoulder 04/05/2019   Past Medical History:  Diagnosis Date   Complete rotator cuff rupture of left shoulder 04/05/2019   Diabetes mellitus without complication [facility])    Past Surgical History:  Procedure Laterality Date   CORNEAL LACERATION REPAIR     Digit Reattachment Right 2022   right pointer finger   SHOULDER ARTHROSCOPY WITH BANKART REPAIR Left 04/05/2019   Procedure: SHOULDER ARTHROSCOPY WITH BANKART REPAIR;  Surgeon: Teryl Lucy, MD;  Location: [facility];  Service: Orthopedics;  Laterality: Left;   SHOULDER ARTHROSCOPY WITH ROTATOR CUFF REPAIR AND SUBACROMIAL DECOMPRESSION Left 04/05/2019   Procedure: LEFT SHOULDER ARTHROSCOPY WITH DEBRIDEMENT, ROTATOR CUFF REPAIR, SUBACROMIAL DECOMPRESSION;  Surgeon: Teryl Lucy, MD;  Location: [facility];  Service: Orthopedics;  Laterality: Left;   History reviewed. No pertinent family history. Outpatient Medications Prior to Visit  Medication Sig Dispense Refill   insulin glargine (LANTUS SOLOSTAR) 100 UNIT/ML Solostar Pen Inject 10 Units into the skin at bedtime. 3 mL 3   prednisoLONE acetate (PRED FORTE) 1 % ophthalmic suspension Apply to eye.  baclofen (LIORESAL) 10 MG tablet Take 1 tablet (10 mg total) by mouth 3 (three) times daily. As needed for muscle spasm (Patient not taking: Reported on 07/22/2023) 50 tablet 0   ondansetron (ZOFRAN) 4 MG tablet Take 1 tablet (4 mg total) by mouth every 8 (eight)  hours as needed for nausea or vomiting. (Patient not taking: Reported on 07/22/2023) 10 tablet 0   oxyCODONE (ROXICODONE) 5 MG immediate release tablet Take 1 tablet (5 mg total) by mouth every 4 (four) hours as needed for severe pain. (Patient not taking: Reported on 07/22/2023) 30 tablet 0   predniSONE (DELTASONE) 50 MG tablet Take 1 pill daily for 5 days as directed (Patient not taking: Reported on 07/22/2023) 5 tablet 0   sennosides-docusate sodium (SENOKOT-S) 8.6-50 MG tablet Take 2 tablets by mouth daily. (Patient not taking: Reported on 07/22/2023) 30 tablet 1   tobramycin (TOBREX) 0.3 % ophthalmic solution Place 1 drop into the right eye every 6 (six) hours. (Patient not taking: Reported on 07/22/2023) 5 mL 0   No facility-administered medications prior to visit.   No Known Allergies   ROS: A complete ROS was performed with pertinent positives/negatives noted in the HPI. The remainder of the ROS are negative.    Objective:   Today's Vitals   08/05/23 1443 08/05/23 1500  BP: (!) 146/96 (!) 156/98  Pulse: 73   SpO2: 99%   Weight: 286 lb 8 oz (130 kg)   Height: 6\' 4"  (1.93 m)     Physical Exam          GENERAL: Well-appearing, in NAD. Well nourished.  SKIN: Pink, warm and dry. No rash, lesion, ulceration, or ecchymoses.  Head: Normocephalic. NECK: Trachea midline. Full ROM w/o pain or tenderness. EYES: Conjunctiva clear without exudates. EOMI, PERRL, no drainage present.  RESPIRATORY: Chest wall symmetrical. Respirations even and non-labored. Breath sounds clear to auscultation bilaterally.  CARDIAC: S1, S2 present, regular rate and rhythm without murmur or gallops. Peripheral pulses 2+ bilaterally.  MSK: Muscle tone and strength appropriate for age.  EXTREMITIES: Without clubbing, cyanosis, or edema.  NEUROLOGIC: No motor or sensory deficits. Steady, even gait. C2-C12 intact.  PSYCH/MENTAL STATUS: Alert, oriented x 3. Cooperative, appropriate mood and affect.   Health  Maintenance Due  Topic Date Due   FOOT EXAM  Never done   OPHTHALMOLOGY EXAM  Never done   Diabetic kidney evaluation - Urine ACR  Never done      Assessment & Plan:  1. Type 2 diabetes mellitus with hyperglycemia, without long-term current use of insulin (HCC) Discussed benefits of changing to GLP-1 from long-acting insulin with patient and he verbalized understanding.  No personal or family history of pancreatitis, medullary thyroid cancer or MEN 2.  Will obtain lipid panel and basic metabolic panel with urine albumin today.  Patient is doing well with diet and exercise changes.  Will start Ozempic 0.25 mg with sample given to patient today in office x 4 weeks and increase to 0.5 mg.  Discussed possible side effects and adverse conditions of this medication and he verbalized understanding. Ophthalmology office located in [place] information given to patient to make a diabetic eye exam appointment.   - Microalbumin / creatinine urine ratio - Basic Metabolic Panel (BMET) - Lipid panel  2. Primary hypertension Uncontrolled, will start on losartan 12.5 to 25 mg daily.  Patient recommended to start on half a tablet and increase to whole tablet after 1 week if blood pressure is not lower than 150/80.  Patient given blood pressure log and recommended to check blood pressure at least 2-3 times a week.  Safe use of this medication reviewed with patient as well.   Meds ordered this encounter  Medications   DISCONTD: Semaglutide,0.25 or 0.5MG /DOS, (OZEMPIC, 0.25 OR 0.5 MG/DOSE,) 2 MG/3ML SOPN    Sig: Inject 0.25 mg into the skin once a week.    Dispense:  9 mL    Refill:  3    Order Specific Question:   Supervising Provider    Answer:   DE [place], RAYMOND J [[id number]]   metFORMIN (GLUCOPHAGE) 500 MG tablet    Sig: Take 1 tablet (500 mg total) by mouth 2 (two) times daily with a meal.    Dispense:  180 tablet    Refill:  3    Order Specific Question:   Supervising Provider    Answer:   DE [place],  RAYMOND J [[id number]]   losartan (COZAAR) 25 MG tablet    Sig: Take 1 tablet (25 mg total) by mouth daily.    Dispense:  90 tablet    Refill:  3    Order Specific Question:   Supervising Provider    Answer:   DE [place], RAYMOND J [[id number]]   Semaglutide,0.25 or 0.5MG /DOS, (OZEMPIC, 0.25 OR 0.5 MG/DOSE,) 2 MG/3ML SOPN    Sig: Inject 0.5 mg into the skin once a week.    Dispense:  9 mL    Refill:  3    Order Specific Question:   Supervising Provider    Answer:   DE [place], RAYMOND J [[id number]]   Lab Orders         Microalbumin / creatinine urine ratio         Basic Metabolic Panel (BMET)         Lipid panel      Return in about 6 weeks (around 09/16/2023) for DIABETES CHECK UP, HYPERTENSION.    Patient to reach out to office if new, worrisome, or unresolved symptoms arise or if no improvement in patient's condition. Patient verbalized understanding and is agreeable to treatment plan. All questions answered to patient's satisfaction.   Of note, portions of this note [place] have been created with voice recognition software [employer], medical). While this note has been edited for accuracy, occasional wrong-word or 'sound-a-like' substitutions [place] have occurred due to the inherent limitations of voice recognition software.  Yolanda Manges, FNP

## 2023-08-06 ENCOUNTER — Ambulatory Visit (HOSPITAL_BASED_OUTPATIENT_CLINIC_OR_DEPARTMENT_OTHER): Payer: BC Managed Care – PPO | Admitting: Family Medicine

## 2023-08-06 LAB — BASIC METABOLIC PANEL
BUN/Creatinine Ratio: 20 (ref 9–20)
BUN: 15 mg/dL (ref 6–24)
CO2: 24 mmol/L (ref 20–29)
Calcium: 9.5 mg/dL (ref 8.7–10.2)
Chloride: 104 mmol/L (ref 96–106)
Creatinine, Ser: 0.74 mg/dL — ABNORMAL LOW (ref 0.76–1.27)
Glucose: 129 mg/dL — ABNORMAL HIGH (ref 70–99)
Potassium: 4.3 mmol/L (ref 3.5–5.2)
Sodium: 143 mmol/L (ref 134–144)
eGFR: 116 mL/min/{1.73_m2} (ref >59)

## 2023-08-06 LAB — MICROALBUMIN / CREATININE URINE RATIO
Creatinine, Urine: 82 mg/dL
Microalb/Creat Ratio: 4 mg/g creat (ref 0–29)
Microalbumin, Urine: 3.2 ug/mL

## 2023-08-06 LAB — LIPID PANEL
Chol/HDL Ratio: 3.7 ratio (ref 0.0–5.0)
Cholesterol, Total: 186 mg/dL (ref 100–199)
HDL: 50 mg/dL (ref >39)
LDL Chol Calc (NIH): 111 mg/dL — ABNORMAL HIGH (ref 0–99)
Triglycerides: 139 mg/dL (ref 0–149)
VLDL Cholesterol Cal: 25 mg/dL (ref 5–40)

## 2023-08-07 ENCOUNTER — Telehealth (HOSPITAL_BASED_OUTPATIENT_CLINIC_OR_DEPARTMENT_OTHER): Payer: Self-pay | Admitting: Family Medicine

## 2023-08-07 ENCOUNTER — Ambulatory Visit (HOSPITAL_BASED_OUTPATIENT_CLINIC_OR_DEPARTMENT_OTHER): Payer: BC Managed Care – PPO | Admitting: Family Medicine

## 2023-08-07 NOTE — Telephone Encounter (Signed)
Prior authorization for Ozempic was initiated and it was approved. Routing this conversation from pt and [place] to [place] for review.

## 2023-08-07 NOTE — Progress Notes (Signed)
Renal function stable. Glucose has improved significantly. Urine albumin is WNL. Discussed lipid lower therapies with patient during visit.

## 2023-08-10 ENCOUNTER — Encounter: Payer: Self-pay | Admitting: Nurse Practitioner

## 2023-08-10 ENCOUNTER — Ambulatory Visit: Payer: BC Managed Care – PPO | Admitting: Nurse Practitioner

## 2023-08-10 VITALS — BP 126/88 | HR 80 | Ht 76.0 in | Wt 288.6 lb

## 2023-08-10 DIAGNOSIS — E119 Type 2 diabetes mellitus without complications: Secondary | ICD-10-CM | POA: Diagnosis not present

## 2023-08-10 DIAGNOSIS — Z794 Long term (current) use of insulin: Secondary | ICD-10-CM

## 2023-08-10 MED ORDER — CVS TRUE METRIX GLUCOSE TEST VI STRP: ORAL_STRIP | 12 refills | Status: DC

## 2023-08-10 MED ORDER — PEN NEEDLES 31G X 6 MM MISC: 3 refills | Status: DC

## 2023-08-10 MED ORDER — LANTUS SOLOSTAR 100 UNIT/ML ~~LOC~~ SOPN: 5.0000 [IU] | PEN_INJECTOR | Freq: Every day | SUBCUTANEOUS | 0 refills | Status: DC

## 2023-08-10 NOTE — Progress Notes (Unsigned)
Endocrinology Consult Note       08/10/2023, 2:22 PM   Subjective:    Patient ID: Henry Middleton, male    DOB: 06-19-1981.  Henry Middleton is being seen in consultation for management of currently uncontrolled symptomatic diabetes requested by  Alyson Reedy, FNP.   Past Medical History:  Diagnosis Date  . Complete rotator cuff rupture of left shoulder 04/05/2019  . Diabetes mellitus without complication [facility])     Past Surgical History:  Procedure Laterality Date  . CORNEAL LACERATION REPAIR    . Digit Reattachment Right 2022   right pointer finger  . SHOULDER ARTHROSCOPY WITH BANKART REPAIR Left 04/05/2019   Procedure: SHOULDER ARTHROSCOPY WITH BANKART REPAIR;  Surgeon: Teryl Lucy, MD;  Location: [facility];  Service: Orthopedics;  Laterality: Left;  . SHOULDER ARTHROSCOPY WITH ROTATOR CUFF REPAIR AND SUBACROMIAL DECOMPRESSION Left 04/05/2019   Procedure: LEFT SHOULDER ARTHROSCOPY WITH DEBRIDEMENT, ROTATOR CUFF REPAIR, SUBACROMIAL DECOMPRESSION;  Surgeon: Teryl Lucy, MD;  Location: [facility];  Service: Orthopedics;  Laterality: Left;    Social History   Socioeconomic History  . Marital status: Married    Spouse name: Not on file  . Number of children: Not on file  . Years of education: Not on file  . Highest education level: Not on file  Occupational History  . Not on file  Tobacco Use  . Smoking status: Never  . Smokeless tobacco: Never  Vaping Use  . Vaping status: Never Used  Substance and Sexual Activity  . Alcohol use: Never  . Drug use: Never  . Sexual activity: Not on file  Other Topics Concern  . Not on file  Social History Narrative  . Not on file   Social Determinants of Health   Financial Resource Strain: Not on file  Food Insecurity: Not on file  Transportation Needs: Not on file  Physical Activity: Not on file  Stress: Not on file   Social Connections: Not on file    No family history on file.  Outpatient Encounter Medications as of 08/10/2023  Medication Sig  . insulin glargine (LANTUS SOLOSTAR) 100 UNIT/ML Solostar Pen Inject 10 Units into the skin at bedtime. Patient states that if  BG steadily goes and states above 200 per PCP , inject 2 additional units  . losartan (COZAAR) 25 MG tablet Take 1 tablet (25 mg total) by mouth daily.  . [DISCONTINUED] insulin glargine (LANTUS) 100 UNIT/ML injection Inject 10 Units into the skin at bedtime. Patient states that if  BG steadily goes and states above 200 per PCP , inject 2 additional units  . metFORMIN (GLUCOPHAGE) 500 MG tablet Take 1 tablet (500 mg total) by mouth 2 (two) times daily with a meal.  . Semaglutide,0.25 or 0.5MG /DOS, (OZEMPIC, 0.25 OR 0.5 MG/DOSE,) 2 MG/3ML SOPN Inject 0.5 mg into the skin once a week.   No facility-administered encounter medications on file as of 08/10/2023.    ALLERGIES: No Known Allergies  VACCINATION STATUS: Immunization History  Administered Date(s) Administered  . Tdap 12/03/2020    HPI   Review of systems  Constitutional: + Minimally fluctuating body weight, current Body mass index is 35.13 kg/m.,  no fatigue, no subjective hyperthermia, no subjective hypothermia Eyes: no blurry vision, no xerophthalmia ENT: no sore throat, no nodules palpated in throat, no dysphagia/odynophagia, no hoarseness Cardiovascular: no chest pain, no shortness of breath, no palpitations, no leg swelling Respiratory: no cough, no shortness of breath Gastrointestinal: no nausea/vomiting/diarrhea Musculoskeletal: no muscle/joint aches Skin: no rashes, no hyperemia Neurological: no tremors, no numbness, no tingling, no dizziness Psychiatric: no depression, no anxiety  Objective:     BP 126/88 (BP Location: Left Arm, Patient Position: Sitting, Cuff Size: Large)   Pulse 80   Ht 6\' 4"  (1.93 m)   Wt 288 lb 9.6 oz (130.9 kg)   BMI 35.13 kg/m    Wt Readings from Last 3 Encounters:  08/10/23 288 lb 9.6 oz (130.9 kg)  08/05/23 286 lb 8 oz (130 kg)  07/22/23 274 lb 4.8 oz (124.4 kg)     BP Readings from Last 3 Encounters:  08/10/23 126/88  08/05/23 (!) 156/98  07/22/23 (!) 157/100     Physical Exam- Limited  Constitutional:  Body mass index is 35.13 kg/m. , not in acute distress, normal state of mind Eyes:  EOMI, no exophthalmos Neck: Supple Thyroid: No gross goiter Cardiovascular: RRR, no murmurs, rubs, or gallops, no edema Respiratory: Adequate breathing efforts, no crackles, rales, rhonchi, or wheezing Musculoskeletal: no gross deformities, strength intact in all four extremities, no gross restriction of joint movements Skin:  no rashes, no hyperemia Neurological: no tremor with outstretched hands    CMP ( most recent) CMP     Component Value Date/Time   NA 143 08/05/2023 1527   K 4.3 08/05/2023 1527   CL 104 08/05/2023 1527   CO2 24 08/05/2023 1527   GLUCOSE 129 (H) 08/05/2023 1527   BUN 15 08/05/2023 1527   CREATININE 0.74 (L) 08/05/2023 1527   CALCIUM 9.5 08/05/2023 1527   PROT 6.9 07/20/2023 1143   ALBUMIN 4.4 07/20/2023 1143   AST 20 07/20/2023 1143   ALT 29 07/20/2023 1143   ALKPHOS 76 07/20/2023 1143   BILITOT 0.7 07/20/2023 1143   EGFR 116 08/05/2023 1527     Diabetic Labs (most recent): Lab Results  Component Value Date   HGBA1C 13.9 (A) 07/22/2023     Lipid Panel ( most recent) Lipid Panel     Component Value Date/Time   CHOL 186 08/05/2023 1527   TRIG 139 08/05/2023 1527   HDL 50 08/05/2023 1527   CHOLHDL 3.7 08/05/2023 1527   LDLCALC 111 (H) 08/05/2023 1527   LABVLDL 25 08/05/2023 1527      No results found for: "TSH", "FREET4"         Assessment & Plan:   There are no diagnoses linked to this encounter.  Henry Middleton has currently uncontrolled symptomatic type 2 DM since *** years of age, with most recent A1c of *** %.   -Recent labs reviewed.  - I had a long  discussion with him about the progressive nature of diabetes and the pathology behind its complications. -his diabetes is complicated by *** and he remains at a high risk for more acute and chronic complications which include CAD, CVA, CKD, retinopathy, and neuropathy. These are all discussed in detail with him.  The following Lifestyle Medicine recommendations according to American College of Lifestyle Medicine [facility]) were discussed and offered to patient and he agrees to start the journey:  A. Whole Foods, Plant-based plate comprising of fruits and vegetables, plant-based proteins, whole-grain carbohydrates was discussed in detail with  the patient.   A list for source of those nutrients were also provided to the patient.  Patient will use only water or unsweetened tea for hydration. B.  The need to stay away from risky substances including alcohol, smoking; obtaining 7 to 9 hours of restorative sleep, at least 150 minutes of moderate intensity exercise weekly, the importance of healthy social connections,  and stress reduction techniques were discussed. C.  A full color page of  Calorie density of various food groups per pound showing examples of each food groups was provided to the patient.  - I have counseled him on diet and weight management by adopting a carbohydrate restricted/protein rich diet. Patient is encouraged to switch to unprocessed or minimally processed complex starch and increased protein intake (animal or plant source), fruits, and vegetables. -  he is advised to stick to a routine mealtimes to eat 3 meals a day and avoid unnecessary snacks (to snack only to correct hypoglycemia).   - he acknowledges that there is a room for improvement in his food and drink choices. - Suggestion is made for him to avoid simple carbohydrates from his diet including Cakes, Sweet Desserts, Ice Cream, Soda (diet and regular), Sweet Tea, Candies, Chips, Cookies, Store Bought Juices, Alcohol in Excess of  1-2 drinks a day, Artificial Sweeteners, Coffee Creamer, and "Sugar-free" Products. This will help patient to have more stable blood glucose profile and potentially avoid unintended weight gain.  - he will be scheduled with Norm Salt, RDN, CDE for diabetes education.  - I have approached him with the following individualized plan to manage his diabetes and patient agrees:    -he is encouraged to start monitoring glucose 4 times daily, before meals and before bed, to log their readings on the clinic sheets provided, and bring them to review at follow up appointment in 2 weeks.  - he is warned not to take insulin without proper monitoring per orders. - Adjustment parameters are given to him for hypo and hyperglycemia in writing. - he is encouraged to call clinic for blood glucose levels less than 70 or above 300 mg /dl. - he is advised to continue ***, therapeutically suitable for patient . - his *** will be discontinued, risk outweighs benefit for this patient. - he is not a candidate for *** due to concurrent renal insufficiency.  - he will be considered for incretin therapy as appropriate next visit.  - Specific targets for  A1c; LDL, HDL, and Triglycerides were discussed with the patient.  2) Blood Pressure /Hypertension:  his blood pressure is controlled to target.   he is advised to continue his current medications including *** mg p.o. daily with breakfast.  3) Lipids/Hyperlipidemia:    Review of his recent lipid panel from *** showed controlled LDL at *** .  he is advised to continue *** mg daily at bedtime.  Side effects and precautions discussed with him.  4)  Weight/Diet:  his Body mass index is 35.13 kg/m.  -  *** clearly complicating his diabetes care.   he is *** a candidate for weight loss. I discussed with him the fact that loss of 5 - 10% of his  current body weight will have the most impact on his diabetes management.  Exercise, and detailed carbohydrates information  provided  -  detailed on discharge instructions.  5) Chronic Care/Health Maintenance: -he *** on ACEI/ARB and Statin medications and is encouraged to initiate and continue to follow up with Ophthalmology, Dentist, Podiatrist at  least yearly or according to recommendations, and advised to *** stay away from smoking. I have recommended yearly flu vaccine and pneumonia vaccine at least every 5 years; moderate intensity exercise for up to 150 minutes weekly; and sleep for at least 7 hours a day.  - he is advised to maintain close follow up with Alyson Reedy, FNP for primary care needs, as well as his other providers for optimal and coordinated care.   - Time spent in this patient care: 60 min, of which > 50% was spent in counseling him about his diabetes and the rest reviewing his blood glucose logs, discussing his hypoglycemia and hyperglycemia episodes, reviewing his current and previous labs/studies (including abstraction from other facilities) and medications doses and developing a long term treatment plan based on the latest standards of care/guidelines; and documenting his care.    Please refer to Patient Instructions for Blood Glucose Monitoring and Insulin/Medications Dosing Guide" in media tab for additional information. Please also refer to "Patient Self Inventory" in the Media tab for reviewed elements of pertinent patient history.  Henry Middleton participated in the discussions, expressed understanding, and voiced agreement with the above plans.  All questions were answered to his satisfaction. he is encouraged to contact clinic should he have any questions or concerns prior to his return visit.     Follow up plan: - No follow-ups on file.    Ronny Bacon, [facility] [address] [place], [place] [address] Phone: [phone] Fax: [phone]  08/10/2023, 2:22 PM

## 2023-08-10 NOTE — Patient Instructions (Signed)

## 2023-08-24 ENCOUNTER — Encounter (HOSPITAL_BASED_OUTPATIENT_CLINIC_OR_DEPARTMENT_OTHER): Payer: Self-pay | Admitting: Family Medicine

## 2023-09-06 ENCOUNTER — Other Ambulatory Visit: Payer: Self-pay | Admitting: Nurse Practitioner

## 2023-09-16 ENCOUNTER — Ambulatory Visit (HOSPITAL_BASED_OUTPATIENT_CLINIC_OR_DEPARTMENT_OTHER): Payer: BC Managed Care – PPO | Admitting: Family Medicine

## 2023-10-01 ENCOUNTER — Encounter (HOSPITAL_BASED_OUTPATIENT_CLINIC_OR_DEPARTMENT_OTHER): Payer: Self-pay | Admitting: Family Medicine

## 2023-11-02 ENCOUNTER — Ambulatory Visit: Payer: Self-pay | Admitting: Nurse Practitioner

## 2023-11-10 ENCOUNTER — Ambulatory Visit: Payer: BC Managed Care – PPO | Admitting: Nurse Practitioner

## 2024-01-04 ENCOUNTER — Ambulatory Visit: Payer: 59 | Admitting: Nurse Practitioner

## 2024-01-04 DIAGNOSIS — E119 Type 2 diabetes mellitus without complications: Secondary | ICD-10-CM

## 2024-01-04 DIAGNOSIS — Z794 Long term (current) use of insulin: Secondary | ICD-10-CM

## 2024-03-09 ENCOUNTER — Other Ambulatory Visit: Payer: Self-pay | Admitting: Nurse Practitioner

## 2024-03-10 MED ORDER — LANTUS SOLOSTAR 100 UNIT/ML ~~LOC~~ SOPN: 5.0000 [IU] | PEN_INJECTOR | Freq: Every day | SUBCUTANEOUS | 0 refills | Status: DC

## 2024-03-21 ENCOUNTER — Ambulatory Visit: Payer: 59 | Admitting: Nurse Practitioner

## 2024-03-21 ENCOUNTER — Encounter: Payer: Self-pay | Admitting: Nurse Practitioner

## 2024-03-21 VITALS — BP 138/96 | HR 92 | Ht 76.0 in | Wt 327.6 lb

## 2024-03-21 DIAGNOSIS — Z794 Long term (current) use of insulin: Secondary | ICD-10-CM

## 2024-03-21 DIAGNOSIS — E119 Type 2 diabetes mellitus without complications: Secondary | ICD-10-CM | POA: Diagnosis not present

## 2024-03-21 LAB — POCT GLYCOSYLATED HEMOGLOBIN (HGB A1C): Hemoglobin A1C: 7 % — AB (ref 4.0–5.6)

## 2024-03-21 MED ORDER — LANTUS SOLOSTAR 100 UNIT/ML ~~LOC~~ SOPN: 10.0000 [IU] | PEN_INJECTOR | Freq: Every day | SUBCUTANEOUS | 1 refills | Status: DC

## 2024-03-21 MED ORDER — GLUCOSE BLOOD VI STRP: ORAL_STRIP | 12 refills | Status: AC

## 2024-03-21 NOTE — Progress Notes (Signed)
 Endocrinology Follow Up Note       03/21/2024, 3:48 PM   Subjective:    Patient ID: Henry Middleton, male    DOB: [birth year].  Henry Middleton is being seen in follow up after being seen in consultation for management of currently uncontrolled symptomatic diabetes requested by  Henry Lefort, MD.   Past Medical History:  Diagnosis Date   Complete rotator cuff rupture of left shoulder 04/05/2019   Diabetes mellitus without complication [facility])     Past Surgical History:  Procedure Laterality Date   CORNEAL LACERATION REPAIR     Digit Reattachment Right 2022   right pointer finger   SHOULDER ARTHROSCOPY WITH BANKART REPAIR Left 04/05/2019   Procedure: SHOULDER ARTHROSCOPY WITH BANKART REPAIR;  Surgeon: Osa Blase, MD;  Location: [facility];  Service: Orthopedics;  Laterality: Left;   SHOULDER ARTHROSCOPY WITH ROTATOR CUFF REPAIR AND SUBACROMIAL DECOMPRESSION Left 04/05/2019   Procedure: LEFT SHOULDER ARTHROSCOPY WITH DEBRIDEMENT, ROTATOR CUFF REPAIR, SUBACROMIAL DECOMPRESSION;  Surgeon: Osa Blase, MD;  Location: [facility];  Service: Orthopedics;  Laterality: Left;    Social History   Socioeconomic History   Marital status: Married    Spouse name: Not on file   Number of children: Not on file   Years of education: Not on file   Highest education level: Not on file  Occupational History   Not on file  Tobacco Use   Smoking status: Never   Smokeless tobacco: Never  Vaping Use   Vaping status: Never Used  Substance and Sexual Activity   Alcohol use: Never   Drug use: Never   Sexual activity: Not on file  Other Topics Concern   Not on file  Social History Narrative   Not on file   Social Drivers of Health   Financial Resource Strain: Not on file  Food Insecurity: Not on file  Transportation Needs: Not on file  Physical Activity: Not on file  Stress: Not on file   Social Connections: Not on file    History reviewed. No pertinent family history.  Outpatient Encounter Medications as of 03/21/2024  Medication Sig   glucose blood test strip Use as instructed to test glucose 1-2 times per day   Insulin Pen Needle (PEN NEEDLES) 31G X 6 MM MISC Use to inject insulin once daily   [DISCONTINUED] insulin glargine  (LANTUS  SOLOSTAR) 100 UNIT/ML Solostar Pen Inject 5 Units into the skin at bedtime.   insulin glargine  (LANTUS  SOLOSTAR) 100 UNIT/ML Solostar Pen Inject 10 Units into the skin at bedtime.   [DISCONTINUED] glucose blood ([employer] TRUE METRIX GLUCOSE TEST) test strip Use as instructed to monitor glucose twice daily   No facility-administered encounter medications on file as of 03/21/2024.    ALLERGIES: No Known Allergies  VACCINATION STATUS: Immunization History  Administered Date(s) Administered   Tdap 12/03/2020    Diabetes He presents for his follow-up diabetic visit. He has type 2 diabetes mellitus. Onset time: recently diagnosed in summer of 2024. His disease course has been improving. There are no hypoglycemic associated symptoms. Associated symptoms include fatigue. Pertinent negatives for diabetes include no blurred vision and no visual change. There are no  hypoglycemic complications. Symptoms are improving. There are no diabetic complications. Risk factors for coronary artery disease include diabetes mellitus, male sex, obesity, hypertension, family history and stress. Current diabetic treatment includes insulin injections. He is compliant with treatment some of the time (just restarted about 2 weeks ago). His weight is increasing steadily. He is following a generally healthy diet. When asked about meal planning, he reported none. He has not had a previous visit with a dietitian. He participates in exercise intermittently. His home blood glucose trend is fluctuating minimally. (He presents today, after long absence, with no meter or logs to review.   He does check glucose at home, has been checking twice daily for the last 2 weeks due to higher readings.  Shortly after last visit, he was able to stop his Lantus  insulin due to diet changes and normoglycemia.  About 2 weeks ago, he started noticing more fatigue, has been more stressed and decided to check glucose and it was in the mid 200 range.  Thus, he restarted Lantus  at 5 units nightly.  He denies any hypoglycemia.) An ACE inhibitor/angiotensin II receptor blocker is not being taken. He does not see a podiatrist.Eye exam is not current.     Review of systems  Constitutional: + increasing body weight (regained some weight he previously lost unintentionally), current Body mass index is 39.88 kg/m., + fatigue, no subjective hyperthermia, no subjective hypothermia Eyes: no blurry vision, no xerophthalmia ENT: no sore throat, no nodules palpated in throat, no dysphagia/odynophagia, no hoarseness Cardiovascular: no chest pain, no shortness of breath, no palpitations, no leg swelling Respiratory: no cough, no shortness of breath Gastrointestinal: no nausea/vomiting/diarrhea Musculoskeletal: no muscle/joint aches Skin: no rashes, no hyperemia Neurological: no tremors, no numbness, no tingling, no dizziness Psychiatric: no depression, no anxiety  Objective:     BP (!) 138/96 (BP Location: Right Arm, Patient Position: Sitting, Cuff Size: Large) Comment: Retake  manuel cuff - patient states he was ask to be be put on BP medications but wanted to talk toa doctor.  Pulse 92   Ht 6\' 4"  (1.93 m)   Wt (!) 327 lb 9.6 oz (148.6 kg)   BMI 39.88 kg/m   Wt Readings from Last 3 Encounters:  03/21/24 (!) 327 lb 9.6 oz (148.6 kg)  08/10/23 288 lb 9.6 oz (130.9 kg)  08/05/23 286 lb 8 oz (130 kg)     BP Readings from Last 3 Encounters:  03/21/24 (!) 138/96  08/10/23 126/88  08/05/23 (!) 156/98      Physical Exam- Limited  Constitutional:  Body mass index is 39.88 kg/m. , not in acute  distress, normal state of mind Eyes:  EOMI, no exophthalmos Musculoskeletal: no gross deformities, strength intact in all four extremities, no gross restriction of joint movements Skin:  no rashes, no hyperemia Neurological: no tremor with outstretched hands  Diabetic Foot Exam - Simple   No data filed      CMP ( most recent) CMP     Component Value Date/Time   NA 143 08/05/2023 1527   K 4.3 08/05/2023 1527   CL 104 08/05/2023 1527   CO2 24 08/05/2023 1527   GLUCOSE 129 (H) 08/05/2023 1527   BUN 15 08/05/2023 1527   CREATININE 0.74 (L) 08/05/2023 1527   CALCIUM 9.5 08/05/2023 1527   PROT 6.9 07/20/2023 1143   ALBUMIN 4.4 07/20/2023 1143   AST 20 07/20/2023 1143   ALT 29 07/20/2023 1143   ALKPHOS 76 07/20/2023 1143   BILITOT 0.7  07/20/2023 1143   EGFR 116 08/05/2023 1527     Diabetic Labs (most recent): Lab Results  Component Value Date   HGBA1C 7.0 (A) 03/21/2024   HGBA1C 13.9 (A) 07/22/2023     Lipid Panel ( most recent) Lipid Panel     Component Value Date/Time   CHOL 186 08/05/2023 1527   TRIG 139 08/05/2023 1527   HDL 50 08/05/2023 1527   CHOLHDL 3.7 08/05/2023 1527   LDLCALC 111 (H) 08/05/2023 1527   LABVLDL 25 08/05/2023 1527      No results found for: "TSH", "FREET4"         Assessment & Plan:   1) Type 2 Diabetes without complication with current use of insulin  He presents today, after long absence, with no meter or logs to review.  His POCT A1c today is 7%, improving drastically from last visit of 13.9%.  He does check glucose at home, has been checking twice daily for the last 2 weeks due to higher readings.  Shortly after last visit, he was able to stop his Lantus  insulin due to diet changes and normoglycemia.  About 2 weeks ago, he started noticing more fatigue, has been more stressed and decided to check glucose and it was in the mid 200 range.  Thus, he restarted Lantus  at 5 units nightly.  He denies any hypoglycemia.  Henry Middleton  has currently uncontrolled symptomatic type 2 DM since 43 years of age.   -Recent labs reviewed.  - I had a long discussion with him about the progressive nature of diabetes and the pathology behind its complications. -his diabetes is complicated by visual changes and he remains at a high risk for more acute and chronic complications which include CAD, CVA, CKD, retinopathy, and neuropathy. These are all discussed in detail with him.  The following Lifestyle Medicine recommendations according to American College of Lifestyle Medicine [facility]) were discussed and offered to patient and he agrees to start the journey:  A. Whole Foods, Plant-based plate comprising of fruits and vegetables, plant-based proteins, whole-grain carbohydrates was discussed in detail with the patient.   A list for source of those nutrients were also provided to the patient.  Patient will use only water or unsweetened tea for hydration. B.  The need to stay away from risky substances including alcohol, smoking; obtaining 7 to 9 hours of restorative sleep, at least 150 minutes of moderate intensity exercise weekly, the importance of healthy social connections,  and stress reduction techniques were discussed. C.  A full color page of  Calorie density of various food groups per pound showing examples of each food groups was provided to the patient.  - Nutritional counseling repeated at each appointment due to patients tendency to fall back in to old habits.  - The patient admits there is a room for improvement in their diet and drink choices. -  Suggestion is made for the patient to avoid simple carbohydrates from their diet including Cakes, Sweet Desserts / Pastries, Ice Cream, Soda (diet and regular), Sweet Tea, Candies, Chips, Cookies, Sweet Pastries, Store Bought Juices, Alcohol in Excess of 1-2 drinks a day, Artificial Sweeteners, Coffee Creamer, and "Sugar-free" Products. This will help patient to have stable blood glucose  profile and potentially avoid unintended weight gain.   - I encouraged the patient to switch to unprocessed or minimally processed complex starch and increased protein intake (animal or plant source), fruits, and vegetables.   - Patient is advised to stick to a  routine mealtimes to eat 3 meals a day and avoid unnecessary snacks (to snack only to correct hypoglycemia).  - I have approached him with the following individualized plan to manage his diabetes and patient agrees:   -He is advised to restart his Lantus  5 units SQ nightly.    -he is encouraged to restart monitoring glucose 2 times daily, before breakfast and before bed, and to call the clinic if he has readings less than 70 or above 300 for 3 tests in a row.  - he is warned not to take insulin without proper monitoring per orders. - Adjustment parameters are given to him for hypo and hyperglycemia in writing.  - his Metformin  and Ozempic  were previously discontinued as he has done reasonably well with insulin only and he is motivated to change lifestyle so he wont need medication moving forward.  - Specific targets for  A1c; LDL, HDL, and Triglycerides were discussed with the patient.  2) Blood Pressure /Hypertension:  his blood pressure is NOT controlled to target.  He was prescribed Losartan  by his new PCP a long time ago but he did not start taking it, nor did he pick it up.  He did not follow up in office as quickly as he was supposed to, but his BP is elevated once again today.  I did discuss with him that if his BP is elevated on another separate visit, we Prentiss need to rethink the medication plan.  He also notes he has white coat syndrome as well.  He has been monitoring BP at home and it has been normal.  3) Lipids/Hyperlipidemia:    Review of his recent lipid panel from 08/05/23 showed uncontrolled LDL at 111.  He is not on any lipid lowering medications at this time and we did discuss lifestyle modifications to avoid him needing  them moving forward.  Will recheck lipid panel prior to next visit.  4)  Weight/Diet:  his Body mass index is 39.88 kg/m.  -  clearly complicating his diabetes care.   he is a candidate for weight loss. I discussed with him the fact that loss of 5 - 10% of his  current body weight will have the most impact on his diabetes management.  Exercise, and detailed carbohydrates information provided  -  detailed on discharge instructions.  5) Chronic Care/Health Maintenance: -he is not on ACEI/ARB or Statin medications and is encouraged to initiate and continue to follow up with Ophthalmology, Dentist, Podiatrist at least yearly or according to recommendations, and advised to stay away from smoking. I have recommended yearly flu vaccine and pneumonia vaccine at least every 5 years; moderate intensity exercise for up to 150 minutes weekly; and sleep for at least 7 hours a day.  - he is advised to maintain close follow up with Henry Lefort, MD for primary care needs, as well as his other providers for optimal and coordinated care.    I spent  42  minutes in the care of the patient today including review of labs from CMP, Lipids, Thyroid Function, Hematology (current and previous including abstractions from other facilities); face-to-face time discussing  his blood glucose readings/logs, discussing hypoglycemia and hyperglycemia episodes and symptoms, medications doses, his options of short and long term treatment based on the latest standards of care / guidelines;  discussion about incorporating lifestyle medicine;  and documenting the encounter. Risk reduction counseling performed per USPSTF guidelines to reduce obesity and cardiovascular risk factors.     Please refer  to Patient Instructions for Blood Glucose Monitoring and Insulin/Medications Dosing Guide"  in media tab for additional information. Please  also refer to " Patient Self Inventory" in the Media  tab for reviewed elements of pertinent  patient history.  Henry Middleton participated in the discussions, expressed understanding, and voiced agreement with the above plans.  All questions were answered to his satisfaction. he is encouraged to contact clinic should he have any questions or concerns prior to his return visit.     Follow up plan: - Return in about 4 months (around 07/22/2024) for Diabetes F/U with A1c in office.   Hulon Magic, [facility] [address] [place], [place] [address] Phone: [phone] Fax: [phone]  03/21/2024, 3:48 PM

## 2024-03-31 ENCOUNTER — Encounter: Payer: Self-pay | Admitting: Nurse Practitioner

## 2024-07-25 ENCOUNTER — Ambulatory Visit: Admitting: Nurse Practitioner

## 2024-08-26 ENCOUNTER — Other Ambulatory Visit: Payer: Self-pay | Admitting: Nurse Practitioner

## 2024-08-26 ENCOUNTER — Other Ambulatory Visit (HOSPITAL_BASED_OUTPATIENT_CLINIC_OR_DEPARTMENT_OTHER): Payer: Self-pay | Admitting: Family Medicine

## 2024-09-19 ENCOUNTER — Telehealth: Payer: Self-pay | Admitting: *Deleted

## 2024-09-19 MED ORDER — LANTUS SOLOSTAR 100 UNIT/ML ~~LOC~~ SOPN: 10.0000 [IU] | PEN_INJECTOR | Freq: Every day | SUBCUTANEOUS | 1 refills | Status: AC

## 2024-09-19 MED ORDER — BD PEN NEEDLE MICRO ULTRAFINE 32G X 6 MM MISC: 3 refills | Status: AC

## 2024-09-19 NOTE — Telephone Encounter (Signed)
 I sent this in for him

## 2024-09-19 NOTE — Telephone Encounter (Signed)
 Yes, I have completed this. He is asking for a refill on his lantus  with pen needles. He uses [facility] on [address]

## 2024-09-19 NOTE — Telephone Encounter (Signed)
 Patient called and left a message that he had appointment tomorrow, then he received a text that he would need lab work. He has not had the lab work done. He is wondering if her should cancel his appointment, as his Dad is having Chemo or a Chemo visit tomorrow.  Please call the patient.

## 2024-09-20 ENCOUNTER — Ambulatory Visit: Admitting: Nurse Practitioner

## 2024-10-21 ENCOUNTER — Other Ambulatory Visit: Payer: Self-pay

## 2024-10-21 DIAGNOSIS — Z794 Long term (current) use of insulin: Secondary | ICD-10-CM

## 2024-10-21 DIAGNOSIS — E119 Type 2 diabetes mellitus without complications: Secondary | ICD-10-CM

## 2024-10-22 LAB — COMPREHENSIVE METABOLIC PANEL WITH GFR
ALT: 49 IU/L — ABNORMAL HIGH (ref 0–44)
AST: 35 IU/L (ref 0–40)
Albumin: 4.7 g/dL (ref 4.1–5.1)
Alkaline Phosphatase: 61 IU/L (ref 47–123)
BUN/Creatinine Ratio: 16 (ref 9–20)
BUN: 16 mg/dL (ref 6–24)
Bilirubin Total: 0.9 mg/dL (ref 0.0–1.2)
CO2: 26 mmol/L (ref 20–29)
Calcium: 9.8 mg/dL (ref 8.7–10.2)
Chloride: 102 mmol/L (ref 96–106)
Creatinine, Ser: 0.97 mg/dL (ref 0.76–1.27)
Globulin, Total: 2.4 g/dL (ref 1.5–4.5)
Glucose: 148 mg/dL — ABNORMAL HIGH (ref 70–99)
Potassium: 5.1 mmol/L (ref 3.5–5.2)
Sodium: 142 mmol/L (ref 134–144)
Total Protein: 7.1 g/dL (ref 6.0–8.5)
eGFR: 99 mL/min/1.73 (ref >59)

## 2024-10-22 LAB — LIPID PANEL
Chol/HDL Ratio: 4 ratio (ref 0.0–5.0)
Cholesterol, Total: 179 mg/dL (ref 100–199)
HDL: 45 mg/dL (ref >39)
LDL Chol Calc (NIH): 113 mg/dL — ABNORMAL HIGH (ref 0–99)
Triglycerides: 117 mg/dL (ref 0–149)
VLDL Cholesterol Cal: 21 mg/dL (ref 5–40)

## 2024-10-22 LAB — T4, FREE: Free T4: 1.18 ng/dL (ref 0.82–1.77)

## 2024-10-22 LAB — TSH: TSH: 1.92 u[IU]/mL (ref 0.450–4.500)

## 2024-10-22 LAB — VITAMIN D 25 HYDROXY (VIT D DEFICIENCY, FRACTURES): Vit D, 25-Hydroxy: 29.4 ng/mL — ABNORMAL LOW (ref 30.0–100.0)

## 2024-10-31 ENCOUNTER — Ambulatory Visit: Admitting: Nurse Practitioner

## 2024-10-31 ENCOUNTER — Encounter: Payer: Self-pay | Admitting: Nurse Practitioner

## 2024-10-31 VITALS — BP 138/88 | HR 86 | Ht 76.0 in | Wt 320.6 lb

## 2024-10-31 DIAGNOSIS — E782 Mixed hyperlipidemia: Secondary | ICD-10-CM

## 2024-10-31 DIAGNOSIS — E559 Vitamin D deficiency, unspecified: Secondary | ICD-10-CM | POA: Diagnosis not present

## 2024-10-31 DIAGNOSIS — Z794 Long term (current) use of insulin: Secondary | ICD-10-CM | POA: Diagnosis not present

## 2024-10-31 DIAGNOSIS — E119 Type 2 diabetes mellitus without complications: Secondary | ICD-10-CM | POA: Diagnosis not present

## 2024-10-31 LAB — POCT GLYCOSYLATED HEMOGLOBIN (HGB A1C): Hemoglobin A1C: 7.6 % — AB (ref 4.0–5.6)

## 2024-10-31 MED ORDER — TIRZEPATIDE 5 MG/0.5ML ~~LOC~~ SOAJ: 5.0000 mg | SUBCUTANEOUS | 1 refills | Status: AC

## 2024-10-31 NOTE — Progress Notes (Signed)
 "                                                                        Endocrinology Follow Up Note       10/31/2024, 3:09 PM   Subjective:    Patient ID: Henry Middleton, male    DOB: [birth year].  Henry Middleton is being seen in follow up after being seen in consultation for management of currently uncontrolled symptomatic diabetes requested by  Duanne Butler DASEN, MD.   Past Medical History:  Diagnosis Date   Complete rotator cuff rupture of left shoulder 04/05/2019   Diabetes mellitus without complication [facility])     Past Surgical History:  Procedure Laterality Date   CORNEAL LACERATION REPAIR     Digit Reattachment Right 2022   right pointer finger   SHOULDER ARTHROSCOPY WITH BANKART REPAIR Left 04/05/2019   Procedure: SHOULDER ARTHROSCOPY WITH BANKART REPAIR;  Surgeon: Josefina Chew, MD;  Location: [facility];  Service: Orthopedics;  Laterality: Left;   SHOULDER ARTHROSCOPY WITH ROTATOR CUFF REPAIR AND SUBACROMIAL DECOMPRESSION Left 04/05/2019   Procedure: LEFT SHOULDER ARTHROSCOPY WITH DEBRIDEMENT, ROTATOR CUFF REPAIR, SUBACROMIAL DECOMPRESSION;  Surgeon: Josefina Chew, MD;  Location: [facility];  Service: Orthopedics;  Laterality: Left;    Social History   Socioeconomic History   Marital status: Married    Spouse name: Not on file   Number of children: Not on file   Years of education: Not on file   Highest education level: Not on file  Occupational History   Not on file  Tobacco Use   Smoking status: Never   Smokeless tobacco: Never  Vaping Use   Vaping status: Never Used  Substance and Sexual Activity   Alcohol use: Never   Drug use: Never   Sexual activity: Not on file  Other Topics Concern   Not on file  Social History Narrative   Not on file   Social Drivers of Health   Tobacco Use: Low Risk (10/31/2024)   Patient History    Smoking Tobacco Use: Never    Smokeless Tobacco Use: Never    Passive Exposure: Not on file   Financial Resource Strain: Not on file  Food Insecurity: Not on file  Transportation Needs: Not on file  Physical Activity: Not on file  Stress: Not on file  Social Connections: Not on file  Depression (PHQ2-9): Medium Risk (07/22/2023)   Depression (PHQ2-9)    PHQ-2 Score: 6  Alcohol Screen: Not on file  Housing: Not on file  Utilities: Not on file  Health Literacy: Not on file    History reviewed. No pertinent family history.  Outpatient Encounter Medications as of 10/31/2024  Medication Sig   glucose blood test strip Use as instructed to test glucose 1-2 times per day   insulin glargine  (LANTUS  SOLOSTAR) 100 UNIT/ML Solostar Pen Inject 10 Units into the skin at bedtime.   Insulin Pen Needle (BD PEN NEEDLE MICRO ULTRAFINE) 32G X 6 MM MISC Use to inject insulin once daily   tirzepatide  (MOUNJARO ) 5 MG/0.5ML Pen Inject 5 mg into the skin once a week.   No facility-administered encounter medications on file as of 10/31/2024.    ALLERGIES: No Known Allergies  VACCINATION STATUS: Immunization History  Administered Date(s) Administered   Tdap 12/03/2020    Diabetes He presents for his follow-up diabetic visit. He has type 2 diabetes mellitus. Onset time: recently diagnosed in summer of 2024. His disease course has been worsening. There are no hypoglycemic associated symptoms. Associated symptoms include fatigue. Pertinent negatives for diabetes include no blurred vision and no visual change. There are no hypoglycemic complications. Symptoms are improving. There are no diabetic complications. Risk factors for coronary artery disease include diabetes mellitus, male sex, obesity, hypertension, family history and stress. Current diabetic treatment includes insulin injections. He is compliant with treatment most of the time. His weight is fluctuating minimally. He is following a generally healthy diet. When asked about meal planning, he reported none. He has not had a previous visit with  a dietitian. He participates in exercise intermittently. His home blood glucose trend is fluctuating minimally. His overall blood glucose range is 180-200 mg/dl. (He presents today with no meter or logs to review.  He does check glucose 1-2 times per day notes his recent readings have been 180-200 range.  His POCT A1c today is 7.6%, increasing from last visit of 7%.  He continues to endorse high stress level.  He has been eating fairly healthy.) An ACE inhibitor/angiotensin II receptor blocker is not being taken. He does not see a podiatrist.Eye exam is not current.     Review of systems  Constitutional: + fluctuating body weight, current Body mass index is 39.02 kg/m., + fatigue, no subjective hyperthermia, no subjective hypothermia Eyes: no blurry vision, no xerophthalmia ENT: no sore throat, no nodules palpated in throat, no dysphagia/odynophagia, no hoarseness Cardiovascular: no chest pain, no shortness of breath, no palpitations, no leg swelling Respiratory: no cough, no shortness of breath Gastrointestinal: no nausea/vomiting/diarrhea Musculoskeletal: no muscle/joint aches Skin: no rashes, no hyperemia Neurological: no tremors, no numbness, no tingling, no dizziness Psychiatric: no depression, no anxiety, + increased stress  Objective:     BP 138/88 (BP Location: Right Arm, Patient Position: Sitting, Cuff Size: Large)   Pulse 86   Ht 6' 4 (1.93 m)   Wt (!) 320 lb 9.6 oz (145.4 kg)   BMI 39.02 kg/m   Wt Readings from Last 3 Encounters:  10/31/24 (!) 320 lb 9.6 oz (145.4 kg)  03/21/24 (!) 327 lb 9.6 oz (148.6 kg)  08/10/23 288 lb 9.6 oz (130.9 kg)     BP Readings from Last 3 Encounters:  10/31/24 138/88  03/21/24 (!) 138/96  08/10/23 126/88      Physical Exam- Limited  Constitutional:  Body mass index is 39.02 kg/m. , not in acute distress, normal state of mind Eyes:  EOMI, no exophthalmos Musculoskeletal: no gross deformities, strength intact in all four  extremities, no gross restriction of joint movements Skin:  no rashes, no hyperemia Neurological: no tremor with outstretched hands  Diabetic Foot Exam - Simple   No data filed      CMP ( most recent) CMP     Component Value Date/Time   NA 142 10/21/2024 0957   K 5.1 10/21/2024 0957   CL 102 10/21/2024 0957   CO2 26 10/21/2024 0957   GLUCOSE 148 (H) 10/21/2024 0957   BUN 16 10/21/2024 0957   CREATININE 0.97 10/21/2024 0957   CALCIUM 9.8 10/21/2024 0957   PROT 7.1 10/21/2024 0957   ALBUMIN 4.7 10/21/2024 0957   AST 35 10/21/2024 0957   ALT 49 (H) 10/21/2024 0957   ALKPHOS 61 10/21/2024 0957  BILITOT 0.9 10/21/2024 0957   EGFR 99 10/21/2024 0957     Diabetic Labs (most recent): Lab Results  Component Value Date   HGBA1C 7.6 (A) 10/31/2024   HGBA1C 7.0 (A) 03/21/2024   HGBA1C 13.9 (A) 07/22/2023     Lipid Panel ( most recent) Lipid Panel     Component Value Date/Time   CHOL 179 10/21/2024 0957   TRIG 117 10/21/2024 0957   HDL 45 10/21/2024 0957   CHOLHDL 4.0 10/21/2024 0957   LDLCALC 113 (H) 10/21/2024 0957   LABVLDL 21 10/21/2024 0957      Lab Results  Component Value Date   TSH 1.920 10/21/2024   FREET4 1.18 10/21/2024           Assessment & Plan:   1) Type 2 Diabetes without complication with current use of insulin  He presents today with no meter or logs to review.  He does check glucose 1-2 times per day notes his recent readings have been 180-200 range.  His POCT A1c today is 7.6%, increasing from last visit of 7%.  He continues to endorse high stress level.  He has been eating fairly healthy.  Henry Middleton has currently uncontrolled symptomatic type 2 DM since 43 years of age.   -Recent labs reviewed.  - I had a long discussion with him about the progressive nature of diabetes and the pathology behind its complications. -his diabetes is complicated by visual changes and he remains at a high risk for more acute and chronic complications  which include CAD, CVA, CKD, retinopathy, and neuropathy. These are all discussed in detail with him.  The following Lifestyle Medicine recommendations according to American College of Lifestyle Medicine [facility]) were discussed and offered to patient and he agrees to start the journey:  A. Whole Foods, Plant-based plate comprising of fruits and vegetables, plant-based proteins, whole-grain carbohydrates was discussed in detail with the patient.   A list for source of those nutrients were also provided to the patient.  Patient will use only water or unsweetened tea for hydration. B.  The need to stay away from risky substances including alcohol, smoking; obtaining 7 to 9 hours of restorative sleep, at least 150 minutes of moderate intensity exercise weekly, the importance of healthy social connections,  and stress reduction techniques were discussed. C.  A full color page of  Calorie density of various food groups per pound showing examples of each food groups was provided to the patient.  - Nutritional counseling repeated/built upon at each appointment.  - The patient admits there is a room for improvement in their diet and drink choices. -  Suggestion is made for the patient to avoid simple carbohydrates from their diet including Cakes, Sweet Desserts / Pastries, Ice Cream, Soda (diet and regular), Sweet Tea, Candies, Chips, Cookies, Sweet Pastries, Store Bought Juices, Alcohol in Excess of 1-2 drinks a day, Artificial Sweeteners, Coffee Creamer, and Sugar-free Products. This will help patient to have stable blood glucose profile and potentially avoid unintended weight gain.   - I encouraged the patient to switch to unprocessed or minimally processed complex starch and increased protein intake (animal or plant source), fruits, and vegetables.   - Patient is advised to stick to a routine mealtimes to eat 3 meals a day and avoid unnecessary snacks (to snack only to correct hypoglycemia).  - I have  approached him with the following individualized plan to manage his diabetes and patient agrees:   -I stopped his Lantus  today  and will trial him on Mounjaro  2.5 mg SQ weekly x 1 month, then increase to 5 mg SQ weekly thereafter if tolerated well.  This will help him with recent elevated LFT, help to reduce cholesterol and weight management.  -he is encouraged to restart monitoring glucose 1 times daily, before breakfast.  - Adjustment parameters are given to him for hypo and hyperglycemia in writing.  - his Metformin  and Ozempic  were previously discontinued as he has done reasonably well with insulin only and he is motivated to change lifestyle so he wont need medication moving forward.  - Specific targets for  A1c; LDL, HDL, and Triglycerides were discussed with the patient.  2) Blood Pressure /Hypertension:  his blood pressure is controlled to target.  He was prescribed Losartan  by his new PCP a long time ago but he did not start taking it, nor did he pick it up.  He did not follow up in office as quickly as he was supposed to, but his BP is elevated once again today.  I did discuss with him that if his BP is elevated on another separate visit, we Grudzien need to rethink the medication plan.  He also notes he has white coat syndrome as well.  He has been monitoring BP at home and it has been normal.  3) Lipids/Hyperlipidemia:    Review of his recent lipid panel from 10/21/24 showed uncontrolled LDL at 113.  He is not on any lipid lowering medications at this time and we did discuss lifestyle modifications to avoid him needing them moving forward.  He is still not at a point where he needs statin just yet.  4)  Weight/Diet:  his Body mass index is 39.02 kg/m.  -  clearly complicating his diabetes care.   he is a candidate for weight loss. I discussed with him the fact that loss of 5 - 10% of his  current body weight will have the most impact on his diabetes management.  Exercise, and detailed  carbohydrates information provided  -  detailed on discharge instructions.  5) Chronic Care/Health Maintenance: -he is not on ACEI/ARB or Statin medications and is encouraged to initiate and continue to follow up with Ophthalmology, Dentist, Podiatrist at least yearly or according to recommendations, and advised to stay away from smoking. I have recommended yearly flu vaccine and pneumonia vaccine at least every 5 years; moderate intensity exercise for up to 150 minutes weekly; and sleep for at least 7 hours a day.  6) Vitamin D  deficiency Recent Vitamin D  level was slightly low.  I did recommend starting an OTC multivitamin with vitamin D .  - he is advised to maintain close follow up with Duanne Butler DASEN, MD for primary care needs, as well as his other providers for optimal and coordinated care.     I spent  46  minutes in the care of the patient today including review of labs from CMP, Lipids, Thyroid Function, Hematology (current and previous including abstractions from other facilities); face-to-face time discussing  his blood glucose readings/logs, discussing hypoglycemia and hyperglycemia episodes and symptoms, medications doses, his options of short and long term treatment based on the latest standards of care / guidelines;  discussion about incorporating lifestyle medicine;  and documenting the encounter. Risk reduction counseling performed per USPSTF guidelines to reduce obesity and cardiovascular risk factors.     Please refer to Patient Instructions for Blood Glucose Monitoring and Insulin/Medications Dosing Guide  in media tab for additional information. Please  also refer to  Patient Self Inventory in the Media  tab for reviewed elements of pertinent patient history.  Henry Middleton participated in the discussions, expressed understanding, and voiced agreement with the above plans.  All questions were answered to his satisfaction. he is encouraged to contact clinic should he have any  questions or concerns prior to his return visit.     Follow up plan: - Return in about 4 months (around 03/01/2025) for Diabetes F/U with A1c in office, No previsit labs, Bring meter and logs.   Benton Rio, [facility] [address] [place], [place] [address] Phone: [phone] Fax: [phone]  10/31/2024, 3:09 PM    "

## 2024-11-14 ENCOUNTER — Encounter: Payer: Self-pay | Admitting: Nurse Practitioner

## 2024-11-14 NOTE — Telephone Encounter (Signed)
 Millicent, have you gotten anything?  PA team, do you have any idea what form they would be sending?

## 2024-11-15 ENCOUNTER — Other Ambulatory Visit (HOSPITAL_COMMUNITY): Payer: Self-pay

## 2024-11-15 ENCOUNTER — Telehealth: Payer: Self-pay

## 2024-11-15 NOTE — Telephone Encounter (Signed)
 Pharmacy Patient Advocate Encounter   Received notification from Pt Calls Messages that prior authorization for Mounjaro  5MG /0.5ML auto-injectors is required/requested.   Insurance verification completed.   The patient is insured through [employer] [facility].   Per test claim: PA required; PA started via CoverMyMeds. KEY [id number] . Waiting for clinical questions to populate.

## 2024-11-21 ENCOUNTER — Telehealth: Payer: Self-pay | Admitting: Pharmacy Technician

## 2024-11-21 NOTE — Telephone Encounter (Signed)
 Previous PA was closed by the insurance and a new one needs to be sent. A second PA has ben expedited. For additional info see Pharmacy Prior Auth telephone encounter from 11/21/24.

## 2024-11-21 NOTE — Telephone Encounter (Signed)
 Pharmacy Patient Advocate Encounter   Received notification from Pt Calls Messages that prior authorization for Mounjaro  5MG /0.5ML auto-injectors  is required/requested.   Insurance verification completed.   The patient is insured through [employer] [facility].   Per test claim: PA required; PA started via CoverMyMeds. KEY [id number] . Waiting for clinical questions to populate.

## 2024-11-23 ENCOUNTER — Other Ambulatory Visit (HOSPITAL_COMMUNITY): Payer: Self-pay

## 2024-11-23 NOTE — Telephone Encounter (Signed)
 Clinical questions have been answered and PA submitted. PA currently Pending. Please be advised that most companies allow up to 30 days to make a decision. We will advise when a determination has been made, or follow up in 1 week.   Please reach out to our team, Rx Prior Auth Pool, if you haven't heard back in a week.

## 2024-11-23 NOTE — Telephone Encounter (Signed)
 Pharmacy Patient Advocate Encounter  Received notification from [employer] [facility] that Prior Authorization for Mounjaro  5MG /0.5ML auto-injectors  has been APPROVED from 11/23/24 to 11/24/27. Ran test claim, Copay is $25.00. This test claim was processed through [facility]- copay amounts Maragh vary at other pharmacies due to pharmacy/plan contracts, or as the patient moves through the different stages of their insurance plan.   PA #/Case ID/Reference #: [id number]

## 2024-11-23 NOTE — Telephone Encounter (Signed)
 Patient was called and a message was left sharing that he had been approved.

## 2025-03-01 ENCOUNTER — Ambulatory Visit: Admitting: Nurse Practitioner
